# Patient Record
Sex: Male | Born: 1960 | Race: White | Hispanic: No | Marital: Married | Smoking: Never smoker
Health system: Southern US, Community
[De-identification: ages and names within clinical notes are randomized; demographics above are authoritative.]

## PROBLEM LIST (undated history)

## (undated) DIAGNOSIS — J309 Allergic rhinitis, unspecified: Secondary | ICD-10-CM

## (undated) DIAGNOSIS — K219 Gastro-esophageal reflux disease without esophagitis: Secondary | ICD-10-CM

## (undated) DIAGNOSIS — K269 Duodenal ulcer, unspecified as acute or chronic, without hemorrhage or perforation: Secondary | ICD-10-CM

## (undated) DIAGNOSIS — R972 Elevated prostate specific antigen [PSA]: Secondary | ICD-10-CM

## (undated) DIAGNOSIS — T7840XA Allergy, unspecified, initial encounter: Secondary | ICD-10-CM

## (undated) DIAGNOSIS — K200 Eosinophilic esophagitis: Secondary | ICD-10-CM

## (undated) DIAGNOSIS — L719 Rosacea, unspecified: Secondary | ICD-10-CM

## (undated) HISTORY — DX: Eosinophilic esophagitis: K20.0

## (undated) HISTORY — PX: COLONOSCOPY: SHX174

## (undated) HISTORY — DX: Allergy, unspecified, initial encounter: T78.40XA

## (undated) HISTORY — DX: Duodenal ulcer, unspecified as acute or chronic, without hemorrhage or perforation: K26.9

## (undated) HISTORY — PX: UPPER GASTROINTESTINAL ENDOSCOPY: SHX188

## (undated) HISTORY — DX: Gastro-esophageal reflux disease without esophagitis: K21.9

## (undated) HISTORY — DX: Allergic rhinitis, unspecified: J30.9

## (undated) HISTORY — DX: Rosacea, unspecified: L71.9

## (undated) HISTORY — DX: Elevated prostate specific antigen (PSA): R97.20

## (undated) HISTORY — PX: PECTUS EXCAVATUM REPAIR: SHX437

---

## 1999-10-22 ENCOUNTER — Encounter: Payer: Self-pay | Admitting: Otolaryngology

## 1999-10-22 ENCOUNTER — Encounter: Admission: RE | Admit: 1999-10-22 | Discharge: 1999-10-22 | Payer: Self-pay | Admitting: Otolaryngology

## 2001-08-30 ENCOUNTER — Other Ambulatory Visit: Admission: RE | Admit: 2001-08-30 | Discharge: 2001-08-30 | Payer: Self-pay | Admitting: Urology

## 2013-10-05 ENCOUNTER — Encounter: Payer: Self-pay | Admitting: Sports Medicine

## 2013-10-05 ENCOUNTER — Ambulatory Visit (INDEPENDENT_AMBULATORY_CARE_PROVIDER_SITE_OTHER): Payer: 59 | Admitting: Sports Medicine

## 2013-10-05 VITALS — BP 106/72 | HR 58 | Ht 73.0 in | Wt 168.0 lb

## 2013-10-05 DIAGNOSIS — M7582 Other shoulder lesions, left shoulder: Secondary | ICD-10-CM | POA: Insufficient documentation

## 2013-10-05 DIAGNOSIS — M719 Bursopathy, unspecified: Secondary | ICD-10-CM

## 2013-10-05 DIAGNOSIS — M25512 Pain in left shoulder: Secondary | ICD-10-CM

## 2013-10-05 DIAGNOSIS — M25519 Pain in unspecified shoulder: Secondary | ICD-10-CM

## 2013-10-05 DIAGNOSIS — M67919 Unspecified disorder of synovium and tendon, unspecified shoulder: Secondary | ICD-10-CM

## 2013-10-05 MED ORDER — NITROGLYCERIN 0.2 MG/HR TD PT24: MEDICATED_PATCH | TRANSDERMAL | Status: DC

## 2013-10-05 NOTE — Assessment & Plan Note (Signed)
We will use a home exercise program  Nitroglycerin protocol  Recheck in 6 weeks  The calcific deposits are fairly large and he might require barbotage of the calcium for removal if he does not respond to conservative care

## 2013-10-05 NOTE — Patient Instructions (Signed)
Nitroglycerin Protocol   Apply 1/4 nitroglycerin patch to affected area daily.  Change position of patch within the affected area every 24 hours.  You may experience a headache during the first 1-2 weeks of using the patch, these should subside.  If you experience headaches after beginning nitroglycerin patch treatment, you may take your preferred over the counter pain reliever.  Another side effect of the nitroglycerin patch is skin irritation or rash related to patch adhesive.  Please notify our office if you develop more severe headaches or rash, and stop the patch.  Tendon healing with nitroglycerin patch may require 12 to 24 weeks depending on the extent of injury.  Men should not use if taking Viagra, Cialis, or Levitra.   Do not use if you have migraines or rosacea.   Please follow up in 6 weeks  Thank you for seeing us today!  

## 2013-10-05 NOTE — Assessment & Plan Note (Signed)
He will use some Aleve if needed but has not had much pain relief with anti-inflammatory medications

## 2013-10-05 NOTE — Progress Notes (Signed)
Patient ID: Darryl Stanton, male   DOB: Nov 01, 1961, 52 y.o.   MRN: [id number]  SUBJECTIVE:  Referred courtesy of Dr Philipp Deputy  [name] is a 52 yo male present today c/o left shoulder pain that has been present for 6-8 months. He was previously seen by is PCP who suspected rotator cuff pathology and recommended strengthening exercises and Mobic. Patient reports minimal relief from this treatments and presented with localized pain over the anterior shoulder with radiation down the bicep muscle. Pain is exacerbated by pulling to reload a riffle, and using the weed wacker. He describes the pain as a aching that is intermittent. Denies any pain at night and no discomfort with overhead activity. Reports a prior injury of falling on this shoulder as a teenage.    OBJECTIVE: BP 106/72 General: Alert and oriented and in NAD. Neck: Full ROM.   Skin: Warm, dry, intact.  No rashes, lesions, ecchymosis, erythema. Vascular: Radial pulses 2+ bilaterally.  No peripheral edema. Resp: No shortness of breath. Psych: Normal mood and affect.  Pleasant and cooperative. Neuro: A&O x3. Sensation to light touch upper extremities equal and intact bilaterally. Normal gait. Shoulder exam:   Palpation: no tenderness over the [facility] joint, acromion, mild bicipital grove tenderness, no supraspinatus tenderness Local TTP at lateral interval of rotator cuff  ROM active/passive: symmetric full 180 degree of abduction and forward flexion, symmetric internal (80-90) and external rotation (90) with shoudler at 90 abduction. Appley's scratch test equal bilaterlly     Strength testing: 5/5 symmetric strength in internal and external rotation, forward flexion, adduction and abduction     Normal sensation with no sensory or motor defects in C4-C8     Special Test: Negative Neers, neg Hawkins, negative empty can, neg obriens, neg speeds, neg apprehension XOver test not painful on left      US Impression: patient has normal  biceps tendon over the bicipital grove. Evidence of calcification within the distal tendon of the subscapularis proximal to the insertion site on the lesser tubercle of humerus. Normal appearence to the supraspinatus tendon. Normal infraspinatus and teres minor. Mild AC joint DJD with no effusion.  None on AC joint on RT.  IMPRESSION/PLAN: 1. 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 Also Humoral abduction starting at  3 lbs and advance as tolerated at 45 deg forward flexion, at 90 deg abduction and 45 deg extension.

## 2013-11-09 ENCOUNTER — Ambulatory Visit (INDEPENDENT_AMBULATORY_CARE_PROVIDER_SITE_OTHER): Payer: 59 | Admitting: Sports Medicine

## 2013-11-09 ENCOUNTER — Encounter: Payer: Self-pay | Admitting: Sports Medicine

## 2013-11-09 VITALS — BP 111/74 | HR 63 | Ht 73.0 in | Wt 168.0 lb

## 2013-11-09 DIAGNOSIS — M7582 Other shoulder lesions, left shoulder: Secondary | ICD-10-CM

## 2013-11-09 DIAGNOSIS — M67919 Unspecified disorder of synovium and tendon, unspecified shoulder: Secondary | ICD-10-CM

## 2013-11-09 NOTE — Assessment & Plan Note (Signed)
Doing well today. Will continue nitroglycerin at home exercise program. Signs of improvement however still some limitation with external rotation. Will followup in 2 months at that time will repeat ultrasound.

## 2013-11-09 NOTE — Progress Notes (Signed)
Patient ID: AVANTAE BITHER, male   DOB: January 18, 1961, 52 y.o.   MRN: [id number] 52 year old male presents for followup of left shoulder calcific tendinitis. Seen approximately 4 weeks ago and diagnosed with calcific tendinitis of the subscapularis tendon. He's been doing home exercise programs been using topical nitroglycerin. Reports doing much better. Still has some discomfort with certain movements and a little bit of limitation with external rotation however, denies significant amounts of pain.  Review of systems as per history of present illness otherwise negative  Examination: BP 111/74  Pulse 63  Ht 6\' 1"  (1.854 m)  Wt 168 lb (76.204 kg)  BMI 22.17 kg/m2 Well-developed well-nourished 52 year old white male awake alert oriented in no acute distress Left Shoulder: Inspection reveals no abnormalities, atrophy or asymmetry. Palpation is normal with no tenderness over AC joint or bicipital groove. ROM is limited with ER to 45 L vs. 60 R Rotator cuff strength normal throughout. Negative Neer and Hawkin's tests, Equivicol empty can. Speeds and Yergason's tests normal. No labral pathology noted with negative Obrien's, negative clunk and good stability. Normal scapular function observed. No painful arc and no drop arm sign. No apprehension sign

## 2014-01-25 ENCOUNTER — Ambulatory Visit (INDEPENDENT_AMBULATORY_CARE_PROVIDER_SITE_OTHER): Payer: 59 | Admitting: Sports Medicine

## 2014-01-25 ENCOUNTER — Encounter: Payer: Self-pay | Admitting: Sports Medicine

## 2014-01-25 VITALS — BP 103/67 | HR 65 | Ht 73.0 in | Wt 168.0 lb

## 2014-01-25 DIAGNOSIS — M719 Bursopathy, unspecified: Secondary | ICD-10-CM

## 2014-01-25 DIAGNOSIS — M67919 Unspecified disorder of synovium and tendon, unspecified shoulder: Secondary | ICD-10-CM

## 2014-01-25 DIAGNOSIS — M7582 Other shoulder lesions, left shoulder: Secondary | ICD-10-CM

## 2014-01-25 NOTE — Assessment & Plan Note (Signed)
Patient doing well today. May discontinue nitroglycerin. Continue home exercises. Followup as needed.

## 2014-01-25 NOTE — Progress Notes (Signed)
Patient ID: Darryl Stanton Darryl Stanton, male   DOB: 28-Oct-1961, 53 y.o.   MRN: [id number] 53 year old male presents for followup of left calcific rotator cuff tendinitis. Overall he's been doing very well. Reports markedly decreased pain. He's been using nitroglycerin without any issues. Doing home exercise programs although he admits to sporadic compliance with this. Currently has no new complaints.   Review of systems as per history of present illness otherwise negative.  Examination: BP 103/67  Pulse 65  Ht 6\' 1"  (1.854 m)  Wt 168 lb (76.204 kg)  BMI 22.17 kg/m2 Left Shoulder: Inspection reveals no abnormalities, atrophy or asymmetry. Palpation is normal with no tenderness over AC joint or bicipital groove. ROM is full in all planes. Rotator cuff strength normal throughout. No signs of impingement with negative Neer and Hawkin's tests, empty can. Speeds and Yergason's tests normal. No labral pathology noted with negative Obrien's, negative clunk and good stability. Normal scapular function observed. No painful arc and no drop arm sign. No apprehension sign  Musculoskeletal ultrasound of the left shoulder Images of the biceps tendon subscapularis, supra-spinatus, teres minor, infraspinatus and glenohumeral joint were obtained. Images consistent with calcific changes/bony spur within the subscapularis tendon. Overall this seems improved from a prior visit however still present. No evidence of impingement with dynamic range of motion ultrasound.

## 2014-01-25 NOTE — Patient Instructions (Signed)
Your shoulder still has evidence of calcium in the rotator cuff tendonitis.  Your symptoms and exam appear to be improving.  You can stop using the nitroglycerin at this time.  Continue the home exercise program.  If the symptoms worsen return and we will consider further intervention.  Follow up with us as needed.

## 2015-04-29 ENCOUNTER — Ambulatory Visit
Admission: RE | Admit: 2015-04-29 | Discharge: 2015-04-29 | Disposition: A | Discharge status: Home / Self Care | Payer: 59 | Source: Ambulatory Visit | Attending: Family Medicine | Admitting: Family Medicine

## 2015-04-29 ENCOUNTER — Other Ambulatory Visit: Payer: Self-pay | Admitting: Family Medicine

## 2015-04-29 DIAGNOSIS — M25531 Pain in right wrist: Secondary | ICD-10-CM

## 2016-12-28 DIAGNOSIS — R972 Elevated prostate specific antigen [PSA]: Secondary | ICD-10-CM | POA: Diagnosis not present

## 2017-02-15 DIAGNOSIS — R972 Elevated prostate specific antigen [PSA]: Secondary | ICD-10-CM | POA: Diagnosis not present

## 2017-02-21 HISTORY — PX: OTHER SURGICAL HISTORY: SHX169

## 2017-02-26 DIAGNOSIS — J069 Acute upper respiratory infection, unspecified: Secondary | ICD-10-CM | POA: Diagnosis not present

## 2017-03-15 DIAGNOSIS — R972 Elevated prostate specific antigen [PSA]: Secondary | ICD-10-CM | POA: Diagnosis not present

## 2017-05-06 DIAGNOSIS — Z23 Encounter for immunization: Secondary | ICD-10-CM | POA: Diagnosis not present

## 2017-05-06 DIAGNOSIS — Z13220 Encounter for screening for lipoid disorders: Secondary | ICD-10-CM | POA: Diagnosis not present

## 2017-05-06 DIAGNOSIS — Z0000 Encounter for general adult medical examination without abnormal findings: Secondary | ICD-10-CM | POA: Diagnosis not present

## 2017-05-18 DIAGNOSIS — Z1211 Encounter for screening for malignant neoplasm of colon: Secondary | ICD-10-CM | POA: Diagnosis not present

## 2017-09-01 DIAGNOSIS — R972 Elevated prostate specific antigen [PSA]: Secondary | ICD-10-CM | POA: Diagnosis not present

## 2017-09-24 DIAGNOSIS — L02232 Carbuncle of back [any part, except buttock]: Secondary | ICD-10-CM | POA: Diagnosis not present

## 2017-11-23 DIAGNOSIS — K200 Eosinophilic esophagitis: Secondary | ICD-10-CM

## 2017-11-23 HISTORY — DX: Eosinophilic esophagitis: K20.0

## 2018-01-19 DIAGNOSIS — R1013 Epigastric pain: Secondary | ICD-10-CM | POA: Diagnosis not present

## 2018-01-20 ENCOUNTER — Other Ambulatory Visit: Payer: Self-pay | Admitting: Family Medicine

## 2018-01-20 DIAGNOSIS — R1013 Epigastric pain: Secondary | ICD-10-CM

## 2018-02-08 ENCOUNTER — Other Ambulatory Visit: Payer: 59

## 2018-02-08 ENCOUNTER — Ambulatory Visit
Admission: RE | Admit: 2018-02-08 | Discharge: 2018-02-08 | Disposition: A | Discharge status: Home / Self Care | Payer: 59 | Source: Ambulatory Visit | Attending: Family Medicine | Admitting: Family Medicine

## 2018-02-08 DIAGNOSIS — R1013 Epigastric pain: Secondary | ICD-10-CM

## 2018-02-08 DIAGNOSIS — R1011 Right upper quadrant pain: Secondary | ICD-10-CM | POA: Diagnosis not present

## 2018-02-14 ENCOUNTER — Other Ambulatory Visit: Payer: 59

## 2018-02-17 ENCOUNTER — Other Ambulatory Visit (HOSPITAL_COMMUNITY): Payer: Self-pay | Admitting: Gastroenterology

## 2018-02-17 DIAGNOSIS — R1011 Right upper quadrant pain: Secondary | ICD-10-CM

## 2018-02-17 DIAGNOSIS — R1013 Epigastric pain: Secondary | ICD-10-CM | POA: Diagnosis not present

## 2018-02-21 HISTORY — PX: ESOPHAGOGASTRODUODENOSCOPY: SHX1529

## 2018-02-23 ENCOUNTER — Encounter (HOSPITAL_COMMUNITY): Payer: 59

## 2018-03-02 ENCOUNTER — Ambulatory Visit (HOSPITAL_COMMUNITY)
Admission: RE | Admit: 2018-03-02 | Discharge: 2018-03-02 | Disposition: A | Discharge status: Home / Self Care | Payer: 59 | Source: Ambulatory Visit | Attending: Gastroenterology | Admitting: Gastroenterology

## 2018-03-02 DIAGNOSIS — R1011 Right upper quadrant pain: Secondary | ICD-10-CM | POA: Diagnosis not present

## 2018-03-02 DIAGNOSIS — R1013 Epigastric pain: Secondary | ICD-10-CM | POA: Insufficient documentation

## 2018-03-02 MED ORDER — TECHNETIUM TC 99M MEBROFENIN IV KIT
5.0000 | PACK | Freq: Once | INTRAVENOUS | Status: AC | PRN
  Administered 2018-03-02: 5 via INTRAVENOUS

## 2018-03-04 DIAGNOSIS — J329 Chronic sinusitis, unspecified: Secondary | ICD-10-CM | POA: Diagnosis not present

## 2018-03-07 DIAGNOSIS — K209 Esophagitis, unspecified: Secondary | ICD-10-CM | POA: Diagnosis not present

## 2018-03-07 DIAGNOSIS — R1013 Epigastric pain: Secondary | ICD-10-CM | POA: Diagnosis not present

## 2018-03-07 DIAGNOSIS — K2930 Chronic superficial gastritis without bleeding: Secondary | ICD-10-CM | POA: Diagnosis not present

## 2018-04-12 DIAGNOSIS — K200 Eosinophilic esophagitis: Secondary | ICD-10-CM | POA: Diagnosis not present

## 2018-05-25 ENCOUNTER — Encounter: Payer: Self-pay | Admitting: Allergy

## 2018-05-25 ENCOUNTER — Telehealth: Payer: Self-pay | Admitting: *Deleted

## 2018-05-25 ENCOUNTER — Ambulatory Visit (INDEPENDENT_AMBULATORY_CARE_PROVIDER_SITE_OTHER): Payer: 59 | Admitting: Allergy

## 2018-05-25 VITALS — BP 110/60 | HR 76 | Temp 98.2°F | Ht 72.5 in | Wt 172.2 lb

## 2018-05-25 DIAGNOSIS — K200 Eosinophilic esophagitis: Secondary | ICD-10-CM

## 2018-05-25 NOTE — Telephone Encounter (Signed)
I called [facility] on behalf of Dr. Delorse LekPadgett to inquire more office notes and GI biopsy results and tests results from his past visits since GI referred him to us. They took our information and stated that they would give us a call back today to discuss further.

## 2018-05-25 NOTE — Progress Notes (Signed)
New Patient Note  RE: Darryl Stanton MRN: [id number] DOB: [birth year] Date of Office Visit: 05/25/2018  Referring provider: Graylin ShiverGanem, Salem F, MD Primary care provider: Lupita RaiderShaw, Kimberlee, MD  Chief Complaint: Eosinophilic esophagitis  History of present illness: Darryl Stanton is a 57 y.o. male presenting today for consultation for eosinophilic esophagitis.  He presents today with his wife.  He reports having symptoms of abdominal pain and heartburn for a while.   He denies any trouble swallowing or symptoms of food impaction.  He is seeing a gastroenterologist, Dr Evette CristalGanem.  He states he has had a negative gallbladder scan.    He thought he had an ulcer as he has had an intestinal ulcer before and some of the symptoms were similar he believes however this ulcer was diagnosed about 20 or so years ago.  He states he was on a PPI that starts with a D (likely Dexilant) for a period of time before he had an endoscopy done.  He states this PPI did nothing to alter his GI symptoms.  The endoscopy was done about several months ago and per patient showed chronic gastritis and esophageal biopsy was concerning for EoE.  He since was started on swallowed budesonide about 6 weeks ago that he gets compounded into a slurry that he does twice a day.  He does feel that since starting on the swallowed steroid that he is having less abdominal/GI symptoms.  He is no longer on the PPI but instead takes a Pepcid as needed.  He has not been able to identify any particular foods that seem to worsen his GI symptoms.  However he feels that more fatty foods may trigger the abdominal pain.   No history of asthma.  No symptoms consistent with seasonal or perennial allergies.  No history of eczema.  However he states he gets a sinus infection several times a year typically treated with a course of antibiotics.     Review of systems: Review of Systems  Constitutional: Negative for chills, fever, malaise/fatigue and weight loss.    HENT: Negative for congestion, ear discharge, ear pain, nosebleeds and sore throat.   Eyes: Negative for pain, discharge and redness.  Respiratory: Negative for cough, shortness of breath and wheezing.   Cardiovascular: Negative for chest pain.  Gastrointestinal: Positive for abdominal pain and heartburn. Negative for constipation, diarrhea, nausea and vomiting.  Musculoskeletal: Negative for joint pain.  Skin: Negative for itching and rash.  Neurological: Negative for headaches.    All other systems negative unless noted above in HPI  Past medical history: Past Medical History:  Diagnosis Date  . Duodenal ulcer   . Eosinophilic esophagitis   . Rosacea     Past surgical history: Past Surgical History:  Procedure Laterality Date  . PECTUS EXCAVATUM REPAIR     Age 57    Family history:  Family History  Problem Relation Age of Onset  . Hyperlipidemia Mother   . Hyperlipidemia Sister   . Allergic rhinitis Neg Hx   . Asthma Neg Hx   . Eczema Neg Hx   . Urticaria Neg Hx     Social history: Lives in a home with his family with out carpeting with gas and electric heating and central cooling.  Dog in the home.  No concern for water damage, mildew or roaches in the home.  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  He denies smoking history.  Medication List: Allergies as of 05/25/2018  No Known Allergies     Medication List        Accurate as of 05/25/18 11:54 AM. Always use your most recent med list.          budesonide 0.5 MG/2ML nebulizer solution Commonly known as:  PULMICORT Take 0.5 mg by nebulization 2 (two) times daily.   famotidine 20 MG tablet Commonly known as:  PEPCID Take 20 mg by mouth 2 (two) times daily.   meloxicam 15 MG tablet Commonly known as:  MOBIC Take 15 mg by mouth daily as needed.       Known medication allergies: No Known Allergies   Physical examination: Blood pressure 110/60, pulse 76, temperature 98.2 F (36.8 C),  temperature source Oral, height 6' 0.5" (1.842 m), weight 172 lb 3.2 oz (78.1 kg), SpO2 97 %.  General: Alert, interactive, in no acute distress. HEENT: PERRLA, TMs pearly gray, turbinates non-edematous without discharge, post-pharynx non erythematous. Neck: Supple without lymphadenopathy. Lungs: Clear to auscultation without wheezing, rhonchi or rales. {no increased work of breathing. CV: Normal S1, S2 without murmurs. Abdomen: Normal bowel sounds, nondistended, nontender. Skin: Warm and dry, without lesions or rashes. Extremities:  No clubbing, cyanosis or edema. Neuro:   Grossly intact.  Diagnositics/Labs: Allergy testing: Food allergy skin prick testing is negative to common food allergens and common foods in his diet.  Histamine control is positive. Allergy testing results were read and interpreted by provider, documented by clinical staff.   Assessment and plan:   Eosinophilic esophagitis   - Food allergen skin prick tests were negative today despite a positive histamine control.   Foods tested today included foods eaten commonly in his diet peanut/tree nuts, soybean, wheat/grains, sesame, milk, egg, shellfish, fish, rice, pork, chicken, beef, tomato, potato (white and sweet), avocado, onion, cabbage, carrots, corn, cucumber, orange, banana, apple, cantaloupe, watermelon, garlic, pepper, mustard.      Skin testing has great negative predictive value with the exception of milk.      - there are certain food groups that have been implicated in triggering EoE symptoms (dairy/milk, grains, meats however any food has potential to be a trigger).      - swallowed steroid +/- a PPI along with dietary modifications are currently the available management options.   Continue your swallowed budesonide slurry twice a day.   Dietary modifications can also be performed to determine if there are certain foods exacerbating symptoms.  With negative testing for food allergy you can perform elimination  diet.  Recommend starting with one food group elimination at a time for 2-3 weeks with re-introduction of the food to confirm presence of symptoms.  If you do notice improvement with symptoms with elimination then you would want to avoid that food item in the diet.   The top common food (SFED diet) are milk, wheat/grain, egg, soy, peanuts/tree nuts, fish/shellfish.   Nuts and seafood are less commonly a trigger of EoE.     Follow up with Dr. Evette Cristal as scheduled for monitoring.  We will be requesting records from Dr. Evette Cristal including biopsy/pathology reports of his endoscopy  Return to clinic in 4-6 months or sooner if needed  I appreciate the opportunity to take part in Aubry's care. Please do not hesitate to contact me with questions.  Sincerely,   Margo Aye, MD Allergy/Immunology [facility]

## 2018-05-25 NOTE — Patient Instructions (Addendum)
Eosinophilic esophagitis   - Food allergen skin prick tests were negative today despite a positive histamine control.   Foods tested today included peanut/tree nuts, soybean, wheat/grains, sesame, milk, egg, shellfish, fish, rice, pork, chicken, beef, tomato, potato (white and sweet), avocado, onion, cabbage, carrots, corn, cucumber, orange, banana, apple, cantaloupe, watermelon, garlic, pepper, mustard.      Skin testing has great negative predictive value with the exception of milk.      - there are certain food groups that have been implicated in triggering EoE symptoms (dairy/milk, grains, meats however any food has potential to be a trigger).      - swallowed steroid +/- a PPI along with dietary modifications are currently the available management options.   Continue your swallowed budesonide slurry twice a day.   Dietary modifications can also be performed to determine if there are certain foods exacerbating symptoms.  With negative testing for food allergy you can perform elimination diet.  Recommend starting with one food group elimination at a time for 2-3 weeks with re-introduction of the food to confirm presence of symptoms.  If you do notice improvement with symptoms with elimination then you would want to avoid that food item in the diet.   The top common food (SFED diet) are milk, wheat/grain, egg, soy, peanuts/tree nuts, fish/shellfish.   Nuts and seafood are less commonly a trigger of EoE.     Follow up with Dr. Evette CristalGanem as scheduled for monitoring.   Return to clinic in 4-6 months or sooner if needed6

## 2018-05-31 DIAGNOSIS — K200 Eosinophilic esophagitis: Secondary | ICD-10-CM | POA: Diagnosis not present

## 2018-06-02 DIAGNOSIS — Z0000 Encounter for general adult medical examination without abnormal findings: Secondary | ICD-10-CM | POA: Diagnosis not present

## 2018-06-02 DIAGNOSIS — Z23 Encounter for immunization: Secondary | ICD-10-CM | POA: Diagnosis not present

## 2018-06-02 DIAGNOSIS — Z13220 Encounter for screening for lipoid disorders: Secondary | ICD-10-CM | POA: Diagnosis not present

## 2018-06-20 DIAGNOSIS — N179 Acute kidney failure, unspecified: Secondary | ICD-10-CM | POA: Diagnosis not present

## 2018-08-04 DIAGNOSIS — Z23 Encounter for immunization: Secondary | ICD-10-CM | POA: Diagnosis not present

## 2018-09-21 DIAGNOSIS — R972 Elevated prostate specific antigen [PSA]: Secondary | ICD-10-CM | POA: Diagnosis not present

## 2018-10-04 DIAGNOSIS — K200 Eosinophilic esophagitis: Secondary | ICD-10-CM | POA: Diagnosis not present

## 2018-12-29 DIAGNOSIS — H0102B Squamous blepharitis left eye, upper and lower eyelids: Secondary | ICD-10-CM | POA: Diagnosis not present

## 2018-12-29 DIAGNOSIS — H0102A Squamous blepharitis right eye, upper and lower eyelids: Secondary | ICD-10-CM | POA: Diagnosis not present

## 2019-03-20 DIAGNOSIS — K200 Eosinophilic esophagitis: Secondary | ICD-10-CM | POA: Diagnosis not present

## 2019-06-20 LAB — BASIC METABOLIC PANEL
BUN: 14 (ref 4–21)
CO2: 28 — AB (ref 13–22)
Chloride: 104 (ref 99–108)
Creatinine: 1.2 (ref 0.6–1.3)
Glucose: 91
Potassium: 4.2 (ref 3.4–5.3)
Sodium: 141 (ref 137–147)

## 2019-06-20 LAB — COMPREHENSIVE METABOLIC PANEL
Albumin: 4.3 (ref 3.5–5.0)
Calcium: 9.3 (ref 8.7–10.7)
GFR calc Af Amer: 78
GFR calc non Af Amer: 65

## 2019-06-20 LAB — LIPID PANEL
Cholesterol: 192 (ref 0–200)
HDL: 71 — AB (ref 35–70)
LDL Cholesterol: 111
LDl/HDL Ratio: 2.7
Triglycerides: 51 (ref 40–160)

## 2019-06-20 LAB — HEPATIC FUNCTION PANEL
ALT: 18 (ref 10–40)
AST: 17 (ref 14–40)
Alkaline Phosphatase: 50 (ref 25–125)
Bilirubin, Total: 0.8

## 2019-06-20 IMAGING — NM NM HEPATO W/GB/PHARM/[PERSON_NAME]
2 series · 12 of 12 positions shown · non-contrast
Comparison: None.

CLINICAL DATA: Right upper quadrant pain

EXAM:
NUCLEAR MEDICINE HEPATOBILIARY IMAGING WITH GALLBLADDER EF
TECHNIQUE: Sequential images of the abdomen were obtained [DATE] minutes
following intravenous administration of radiopharmaceutical. After
oral ingestion of Ensure, gallbladder ejection fraction was
determined. At 60 min, normal ejection fraction is greater than 33%.
RADIOPHARMACEUTICALS:  5.2 mCi Nc-33m  Choletec IV

[he hepatobiliary · 3.10mm/px · 6 of 51 frames shown (1 of 2)]
[frame 5/51]
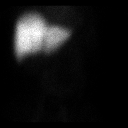
[frame 13/51]
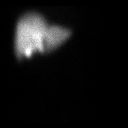
[frame 22/51]
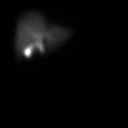
[frame 30/51]
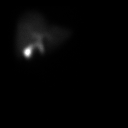
[frame 39/51]
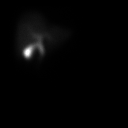
[frame 47/51]
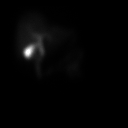

[he hepatobiliary · 3.10mm/px · 6 of 60 frames shown (2 of 2)]
[frame 6/60]
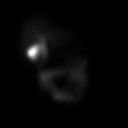
[frame 16/60]
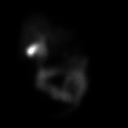
[frame 26/60]
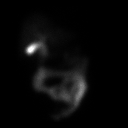
[frame 36/60]
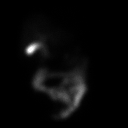
[frame 46/60]
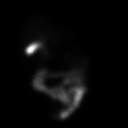
[frame 56/60]
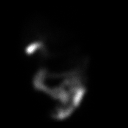

[12 of 12 positions shown; findings below may reference images not displayed]

FINDINGS: Prompt uptake and biliary excretion of activity by the liver is
seen. Gallbladder activity is visualized, consistent with patency of
cystic duct. Biliary activity passes into small bowel, consistent
with patent common bile duct.

Calculated gallbladder ejection fraction is 52%. (Normal gallbladder
ejection fraction with Ensure is greater than 33%.)
IMPRESSION: Normal uptake and excretion of biliary tracer.

Normal gallbladder ejection fraction

## 2020-08-12 ENCOUNTER — Other Ambulatory Visit: Payer: Self-pay

## 2020-08-13 ENCOUNTER — Ambulatory Visit (INDEPENDENT_AMBULATORY_CARE_PROVIDER_SITE_OTHER): Payer: No Typology Code available for payment source | Admitting: Family Medicine

## 2020-08-13 ENCOUNTER — Encounter: Payer: Self-pay | Admitting: Family Medicine

## 2020-08-13 VITALS — BP 127/86 | HR 66 | Temp 98.5°F | Resp 16 | Ht 72.5 in | Wt 170.0 lb

## 2020-08-13 DIAGNOSIS — Z8719 Personal history of other diseases of the digestive system: Secondary | ICD-10-CM | POA: Diagnosis not present

## 2020-08-13 DIAGNOSIS — Z23 Encounter for immunization: Secondary | ICD-10-CM

## 2020-08-13 DIAGNOSIS — Z125 Encounter for screening for malignant neoplasm of prostate: Secondary | ICD-10-CM

## 2020-08-13 DIAGNOSIS — Z0000 Encounter for general adult medical examination without abnormal findings: Secondary | ICD-10-CM

## 2020-08-13 DIAGNOSIS — Z1211 Encounter for screening for malignant neoplasm of colon: Secondary | ICD-10-CM

## 2020-08-13 DIAGNOSIS — K200 Eosinophilic esophagitis: Secondary | ICD-10-CM | POA: Diagnosis not present

## 2020-08-13 LAB — CBC WITH DIFFERENTIAL/PLATELET
Basophils Absolute: 0 10*3/uL (ref 0.0–0.1)
Basophils Relative: 0.9 % (ref 0.0–3.0)
Eosinophils Absolute: 0.1 10*3/uL (ref 0.0–0.7)
Eosinophils Relative: 1.9 % (ref 0.0–5.0)
HCT: 41.7 % (ref 39.0–52.0)
Hemoglobin: 13.9 g/dL (ref 13.0–17.0)
Lymphocytes Relative: 28.4 % (ref 12.0–46.0)
Lymphs Abs: 1.1 10*3/uL (ref 0.7–4.0)
MCHC: 33.4 g/dL (ref 30.0–36.0)
MCV: 96 fl (ref 78.0–100.0)
Monocytes Absolute: 0.5 10*3/uL (ref 0.1–1.0)
Monocytes Relative: 13.7 % — ABNORMAL HIGH (ref 3.0–12.0)
Neutro Abs: 2.2 10*3/uL (ref 1.4–7.7)
Neutrophils Relative %: 55.1 % (ref 43.0–77.0)
Platelets: 213 10*3/uL (ref 150.0–400.0)
RBC: 4.35 Mil/uL (ref 4.22–5.81)
RDW: 14.3 % (ref 11.5–15.5)
WBC: 3.9 10*3/uL — ABNORMAL LOW (ref 4.0–10.5)

## 2020-08-13 LAB — COMPREHENSIVE METABOLIC PANEL
ALT: 28 U/L (ref 0–53)
AST: 23 U/L (ref 0–37)
Albumin: 4.5 g/dL (ref 3.5–5.2)
Alkaline Phosphatase: 72 U/L (ref 39–117)
BUN: 16 mg/dL (ref 6–23)
CO2: 30 mEq/L (ref 19–32)
Calcium: 9.5 mg/dL (ref 8.4–10.5)
Chloride: 103 mEq/L (ref 96–112)
Creatinine, Ser: 1.13 mg/dL (ref 0.40–1.50)
GFR: 66.33 mL/min (ref >60.00)
Glucose, Bld: 89 mg/dL (ref 70–99)
Potassium: 4.3 mEq/L (ref 3.5–5.1)
Sodium: 139 mEq/L (ref 135–145)
Total Bilirubin: 0.7 mg/dL (ref 0.2–1.2)
Total Protein: 6.9 g/dL (ref 6.0–8.3)

## 2020-08-13 LAB — LIPID PANEL
Cholesterol: 198 mg/dL (ref 0–200)
HDL: 74.5 mg/dL (ref >39.00)
LDL Cholesterol: 112 mg/dL — ABNORMAL HIGH (ref 0–99)
NonHDL: 123.37
Total CHOL/HDL Ratio: 3
Triglycerides: 57 mg/dL (ref 0.0–149.0)
VLDL: 11.4 mg/dL (ref 0.0–40.0)

## 2020-08-13 LAB — TSH: TSH: 2.21 u[IU]/mL (ref 0.35–4.50)

## 2020-08-13 NOTE — Addendum Note (Signed)
Addended by: Emi Holes D on: 08/13/2020 09:51 AM   Modules accepted: Orders

## 2020-08-13 NOTE — Progress Notes (Signed)
Office Note 08/13/2020  CC:  Chief Complaint  Patient presents with  . New Patient (Initial Visit)    flu shot    HPI:  Darryl Stanton is a 59 y.o. White male who is here for annual health maintenance exam. Patient's most recent primary MD: Brooks Sailors, had to switch from them and all of his specialists b/c of insurance reasons. Old records in EPIC/HL EMR were reviewed prior to or during today's visit.  He has been doing intermittent fasting diet since 03/2020. Walks for exercise.  No acute complaints.  Past Medical History:  Diagnosis Date  . Allergic rhinitis   . Duodenal ulcer   . Elevated PSA    bx neg  . Eosinophilic esophagitis 2019   EGD appearance did not match + bx results. Allergy testing neg with allergist.  Oral budesonide slurry + food vigilance for food trigger. of sx's.  . Rosacea     Past Surgical History:  Procedure Laterality Date  . COLONOSCOPY  approx 2012  . ESOPHAGOGASTRODUODENOSCOPY  02/2018  . PECTUS EXCAVATUM REPAIR     Age 59  . prsostate biopsy  02/2017   for elevated PSA; 1 core "suspicious" but urol felt it was NOT prostate ca (Dr. Retta Diones): most recent PSA 08/2019 stable.    Family History  Problem Relation Age of Onset  . Hyperlipidemia Mother   . Hyperlipidemia Sister   . Allergic rhinitis Neg Hx   . Asthma Neg Hx   . Eczema Neg Hx   . Urticaria Neg Hx     Social History   Socioeconomic History  . Marital status: Married    Spouse name: Not on file  . Number of children: Not on file  . Years of education: Not on file  . Highest education level: Not on file  Occupational History  . Not on file  Tobacco Use  . Smoking status: Never Smoker  . Smokeless tobacco: Former Neurosurgeon    Types: Engineer, drilling  . Vaping Use: Never used  Substance and Sexual Activity  . Alcohol use: Yes    Alcohol/week: 2.0 standard drinks    Types: 2 Cans of beer per week    Comment: a couple time a week  . Drug use: Never  . Sexual  activity: Not on file  Other Topics Concern  . Not on file  Social History Narrative   Married, 2 sons.   Orig from [place], [place].   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   No tob, occ alc.   Social Determinants of Health   Financial Resource Strain:   . Difficulty of Paying Living Expenses: Not on file  Food Insecurity:   . Worried About Programme researcher, broadcasting/film/video in the Last Year: Not on file  . Ran Out of Food in the Last Year: Not on file  Transportation Needs:   . Lack of Transportation (Medical): Not on file  . Lack of Transportation (Non-Medical): Not on file  Physical Activity:   . Days of Exercise per Week: Not on file  . Minutes of Exercise per Session: Not on file  Stress:   . Feeling of Stress : Not on file  Social Connections:   . Frequency of Communication with Friends and Family: Not on file  . Frequency of Social Gatherings with Friends and Family: Not on file  . Attends Religious Services: Not on file  . Active Member of Clubs or  Organizations: Not on file  . Attends Banker Meetings: Not on file  . Marital Status: Not on file  Intimate Partner Violence:   . Fear of Current or Ex-Partner: Not on file  . Emotionally Abused: Not on file  . Physically Abused: Not on file  . Sexually Abused: Not on file    Outpatient Encounter Medications as of 08/13/2020  Medication Sig  . budesonide (PULMICORT) 0.5 MG/2ML nebulizer solution Take 0.5 mg by nebulization 2 (two) times daily.  . famotidine (PEPCID) 20 MG tablet Take 10 mg by mouth daily.   . [DISCONTINUED] meloxicam (MOBIC) 15 MG tablet Take 15 mg by mouth daily as needed.   No facility-administered encounter medications on file as of 08/13/2020.    No Known Allergies  ROS Review of Systems  Constitutional: Negative for appetite change, chills, fatigue and fever.  HENT: Negative for congestion, dental problem, ear pain and sore throat.   Eyes:  Negative for discharge, redness and visual disturbance.  Respiratory: Negative for cough, chest tightness, shortness of breath and wheezing.   Cardiovascular: Negative for chest pain, palpitations and leg swelling.  Gastrointestinal: Negative for abdominal pain, blood in stool, diarrhea, nausea and vomiting.  Genitourinary: Negative for difficulty urinating, dysuria, flank pain, frequency, hematuria and urgency.  Musculoskeletal: Negative for arthralgias, back pain, joint swelling, myalgias and neck stiffness.  Skin: Negative for pallor and rash.  Neurological: Negative for dizziness, speech difficulty, weakness and headaches.  Hematological: Negative for adenopathy. Does not bruise/bleed easily.  Psychiatric/Behavioral: Negative for confusion and sleep disturbance. The patient is not nervous/anxious.     PE; Blood pressure 127/86, pulse 66, temperature 98.5 F (36.9 C), temperature source Temporal, resp. rate 16, height 6' 0.5" (1.842 m), weight 170 lb (77.1 kg), SpO2 99 %. Body mass index is 22.74 kg/m.  Gen: Alert, well appearing.  Patient is oriented to person, place, time, and situation. AFFECT: pleasant, lucid thought and speech. ENT: Ears: EACs clear, normal epithelium.  TMs with good light reflex and landmarks bilaterally.  Eyes: no injection, icteris, swelling, or exudate.  EOMI, PERRLA. Nose: no drainage or turbinate edema/swelling.  No injection or focal lesion.  Mouth: lips without lesion/swelling.  Oral mucosa pink and moist.  Dentition intact and without obvious caries or gingival swelling.  Oropharynx without erythema, exudate, or swelling.  Neck: supple/nontender.  No LAD, mass, or TM.  Carotid pulses 2+ bilaterally, without bruits. CV: RRR, no m/r/g.   LUNGS: CTA bilat, nonlabored resps, good aeration in all lung fields. ABD: soft, NT, ND, BS normal.  No hepatospenomegaly or mass.  No bruits. EXT: no clubbing, cyanosis, or edema.  Musculoskeletal: no joint swelling,  erythema, warmth, or tenderness.  ROM of all joints intact. Skin - no sores or suspicious lesions or rashes or color changes Rectal: deferred--this is done by his urol  Pertinent labs:  NONE IN EMR  ASSESSMENT AND PLAN:   New pt:  Health maintenance exam: Reviewed age and gender appropriate health maintenance issues (prudent diet, regular exercise, health risks of tobacco and excessive alcohol, use of seatbelts, fire alarms in home, use of sunscreen).  Also reviewed age and gender appropriate health screening as well as vaccine recommendations. Vaccines: Covid UTD.  Tdap->updated today (no date of last Td known).  Flu vaccine today. Labs: fasting HP ordered. Prostate ca screening: followed by urol. Colon ca screening:  He'll get this 2022 at some point.  Hx ox eosinophilic esophagitis: well controlled on budesonide slurry bid and H2 blocker.  Due for routine GI f/u but needs to GI MD due to insurance reasons-->referred to [facility] today. They will discuss timing of next screening colonoscopy as well---likely to be in 2022 based on pt's report of date of 1st TCS at age 7).  Hx of elevated PSA: bx benign per pt report, PSA's stable per his report.  He is in process of seeing if he can continue seeing Dr. Retta Diones with [facility] or not (insurance).  An After Visit Summary was printed and given to the patient.  Return in about 1 year (around 08/13/2021) for annual CPE (fasting).  Signed:  Santiago Bumpers, MD           08/13/2020

## 2020-08-13 NOTE — Patient Instructions (Signed)

## 2020-08-23 ENCOUNTER — Telehealth: Payer: Self-pay

## 2020-08-23 NOTE — Telephone Encounter (Signed)
Patient dropped off form to be completed per his company. Proof of Annual Physical Exam  Was seen by Dr. Milinda Cave on 9/21  Please call Darryl Stanton at [phone] when form completed. He will pick up at front office. Form given to [place]. 10/1 dnh

## 2020-08-23 NOTE — Telephone Encounter (Signed)
Signed and put in box to go up front. Signed:  Santiago Bumpers, MD           08/23/2020

## 2020-08-23 NOTE — Telephone Encounter (Signed)
Placed on PCP desk to review and sign, if appropriate.  

## 2020-10-22 ENCOUNTER — Ambulatory Visit (INDEPENDENT_AMBULATORY_CARE_PROVIDER_SITE_OTHER): Payer: No Typology Code available for payment source | Admitting: Urology

## 2020-10-22 ENCOUNTER — Encounter: Payer: Self-pay | Admitting: Urology

## 2020-10-22 ENCOUNTER — Other Ambulatory Visit: Payer: Self-pay

## 2020-10-22 VITALS — BP 127/63 | HR 69 | Temp 98.6°F | Ht 72.5 in | Wt 165.0 lb

## 2020-10-22 DIAGNOSIS — N400 Enlarged prostate without lower urinary tract symptoms: Secondary | ICD-10-CM

## 2020-10-22 LAB — URINALYSIS, ROUTINE W REFLEX MICROSCOPIC
Bilirubin, UA: NEGATIVE
Glucose, UA: NEGATIVE
Ketones, UA: NEGATIVE
Leukocytes,UA: NEGATIVE
Nitrite, UA: NEGATIVE
Protein,UA: NEGATIVE
RBC, UA: NEGATIVE
Specific Gravity, UA: 1.025 (ref 1.005–1.030)
Urobilinogen, Ur: 0.2 mg/dL (ref 0.2–1.0)
pH, UA: 5.5 (ref 5.0–7.5)

## 2020-10-22 NOTE — Progress Notes (Signed)
H&P  Chief Complaint: Elevated PSA  History of Present Illness:   11.30.2021: Pt here for annual f/u and denies onset of any significant LUTS between visits. He reports staying active and denies any ED symptoms at this time.  IPSS Questionnaire (AUA-7): Over the past month.   1)  How often have you had a sensation of not emptying your bladder completely after you finish urinating?  0 - Not at all  2)  How often have you had to urinate again less than two hours after you finished urinating? 3 - About half the time  3)  How often have you found you stopped and started again several times when you urinated?  0 - Not at all  4) How difficult have you found it to postpone urination?  0 - Not at all  5) How often have you had a weak urinary stream?  0 - Not at all  6) How often have you had to push or strain to begin urination?  0 - Not at all  7) How many times did you most typically get up to urinate from the time you went to bed until the time you got up in the morning?  1 - 1 time  Total score:  0-7 mildly symptomatic   8-19 moderately symptomatic   20-35 severely symptomatic  QoL Score: 1   (below copied from [facility] records):  Darryl Stanton is a 59 year-old male established patient who is here for follow up for further evaluation of his elevated PSA. His last PSA was performed 09/21/2018. The last PSA value was 3.14.   He has had a prostate biopsy done.   Underwent TRUS/Bx of prostate on 4.23.2018. Prostatic volume 19.5 ml. PSA 3.75. PSAD 0.19. 1/12 cores (left apex medial) returned atypia.   10.26.2020: Most recent PSA was 3.14 (10.30.19). Aside from some occasional urinary urgency, he denies any significant urinary complaints. He denies drinking much caffeine but he does believe that drinking elderberry juice has made this worse.   Past Medical History:  Diagnosis Date  . Allergic rhinitis   . Duodenal ulcer   . Elevated PSA    bx neg  . Eosinophilic esophagitis 2019   EGD  appearance did not match + bx results. Allergy testing neg with allergist.  Oral budesonide slurry + food vigilance for food trigger. of sx's.  . Rosacea     Past Surgical History:  Procedure Laterality Date  . COLONOSCOPY  approx 2012  . ESOPHAGOGASTRODUODENOSCOPY  02/2018  . PECTUS EXCAVATUM REPAIR     Age 40  . prsostate biopsy  02/2017   for elevated PSA; 1 core "suspicious" but urol felt it was NOT prostate ca (Dr. Retta Diones): most recent PSA 08/2019 stable.    Home Medications:  Allergies as of 10/22/2020   No Known Allergies     Medication List       Accurate as of October 22, 2020 12:39 PM. If you have any questions, ask your nurse or doctor.        budesonide 0.5 MG/2ML nebulizer solution Commonly known as: PULMICORT Take 0.5 mg by nebulization 2 (two) times daily.   famotidine 20 MG tablet Commonly known as: PEPCID Take 10 mg by mouth daily.       Allergies: No Known Allergies  Family History  Problem Relation Age of Onset  . Hyperlipidemia Mother   . Hyperlipidemia Sister   . Allergic rhinitis Neg Hx   . Asthma Neg Hx   .  Eczema Neg Hx   . Urticaria Neg Hx     Social History:  reports that he has never smoked. He quit smokeless tobacco use about 35 years ago.  His smokeless tobacco use included chew. He reports current alcohol use of about 2.0 standard drinks of alcohol per week. He reports that he does not use drugs.  ROS: A complete review of systems was performed.  All systems are negative except for pertinent findings as noted.  Physical Exam:  Vital signs in last 24 hours: There were no vitals taken for this visit. Constitutional:  Alert and oriented, No acute distress Respiratory: Normal respiratory effort GI: Abdomen is soft, nontender, nondistended, no abdominal masses. No CVAT. No hernias Genitourinary: Normal male phallus, testes are descended bilaterally and non-tender and without masses, scrotum is normal in appearance without  lesions or masses, perineum is normal on inspection. Prostate feels about 30 grams. Neurologic: Grossly intact, no focal deficits Psychiatric: Normal mood and affect  I have reviewed prior pt notes  I have reviewed notes from referring/previous physicians  I have reviewed urinalysis results  I have independently reviewed prior imaging  I have reviewed prior PSA results    Impression/Assessment:  Elevated PSA/negative prior bx.  Pt PSA pending for today.  Overall, prostate exams and PSAs have been stable.  Plan:  1. Pt PSA will be drawn today.  2. Pt advised regarding heart-healthy/prostate-healthy diet and lifestyle.  3. F/U in 1 year for OV and PSA  CC: Dr. Nicoletta Ba

## 2020-10-22 NOTE — Progress Notes (Signed)

## 2020-10-23 LAB — PSA: Prostate Specific Ag, Serum: 2.7 ng/mL (ref 0.0–4.0)

## 2020-10-25 ENCOUNTER — Telehealth: Payer: Self-pay

## 2020-10-25 ENCOUNTER — Encounter: Payer: Self-pay | Admitting: Internal Medicine

## 2020-10-25 NOTE — Telephone Encounter (Signed)
Pt made aware of normal PSA.

## 2020-10-25 NOTE — Telephone Encounter (Signed)
-----   Message from Marcine Matar, MD sent at 10/24/2020 11:58 AM EST ----- Notify patient PSA is 2.7/normal ----- Message ----- From: Dalia Heading, LPN Sent: 89/01/8100   8:39 AM EST To: Marcine Matar, MD  Pls review.

## 2020-12-05 ENCOUNTER — Ambulatory Visit (INDEPENDENT_AMBULATORY_CARE_PROVIDER_SITE_OTHER): Payer: No Typology Code available for payment source | Admitting: Internal Medicine

## 2020-12-05 ENCOUNTER — Encounter: Payer: Self-pay | Admitting: Internal Medicine

## 2020-12-05 VITALS — BP 118/80 | HR 60 | Ht 77.0 in | Wt 170.6 lb

## 2020-12-05 DIAGNOSIS — K200 Eosinophilic esophagitis: Secondary | ICD-10-CM

## 2020-12-05 DIAGNOSIS — K219 Gastro-esophageal reflux disease without esophagitis: Secondary | ICD-10-CM | POA: Diagnosis not present

## 2020-12-05 DIAGNOSIS — Z1211 Encounter for screening for malignant neoplasm of colon: Secondary | ICD-10-CM | POA: Diagnosis not present

## 2020-12-05 NOTE — Patient Instructions (Signed)
  Please follow up with Dr. Marina Goodell as neededc  Due to recent changes in healthcare laws, you may see the results of your imaging and laboratory studies on MyChart before your provider has had a chance to review them.  We understand that in some cases there may be results that are confusing or concerning to you. Not all laboratory results come back in the same time frame and the provider may be waiting for multiple results in order to interpret others.  Please give [place] 48 hours in order for your provider to thoroughly review all the results before contacting the office for clarification of your results.   If you are age 6 or older, your body mass index should be between 23-30. Your Body mass index is 20.23 kg/m. If this is out of the aforementioned range listed, please consider follow up with your Primary Care Provider.  If you are age 50 or younger, your body mass index should be between 19-25. Your Body mass index is 20.23 kg/m. If this is out of the aformentioned range listed, please consider follow up with your Primary Care Provider.

## 2020-12-05 NOTE — Progress Notes (Signed)
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  Previous GI care provided by Dr. Karma Greaser until his retirement.  Patient carries a diagnosis of "eosinophilic esophagitis" for which she is currently on budesonide slurry 2 mg once daily.  I have reviewed outside records from [facility] gastroenterology.  As well pathology.  In brief, the patient was evaluated in 2019 for vague dyspeptic symptoms.  The patient describes it as "nervousness" in the region of the epigastric area.  He does have a history of mild intermittent classic reflux symptoms for which she uses on-demand H2 receptor antagonist therapy.  No history of problems with dysphagia.  For his dyspeptic symptoms he did undergo upper endoscopy March 07, 2018.  This was entirely normal.  Biopsies were taken from the gastroesophageal junction and stomach.  Gastric biopsies revealed nonspecific "chronic gastritis" without Helicobacter pylori.  Esophageal biopsies were said to show 31 eosinophils per high-power field which raised a question of eosinophilic esophagitis.  No repeat endoscopy with or without high-dose PPI performed.  He was placed on budesonide slurry 2 g twice daily.  At 1 point this was decreased to once daily.  Patient has had no significant abdominal complaints.  Does have ongoing occasional mild intermittent reflux.  No dysphagia.  He also underwent routine colonoscopy around 2012 or 2013.  This was reported to be normal in the outside record notes.  No family history of colon cancer.  He has completed his COVID vaccination series  REVIEW OF SYSTEMS:  All non-GI ROS negative unless otherwise stated in the HPI except for occasional sinus troubles  Past Medical History:  Diagnosis Date  . Allergic rhinitis   . Duodenal ulcer   . Elevated PSA    bx neg  . Eosinophilic esophagitis 2019   EGD appearance did not match + bx results.  Allergy testing neg with allergist.  Oral budesonide slurry + food vigilance for food trigger. of sx's.  . Rosacea     Past Surgical History:  Procedure Laterality Date  . COLONOSCOPY  approx 2012  . ESOPHAGOGASTRODUODENOSCOPY  02/2018  . PECTUS EXCAVATUM REPAIR     Age 36  . prsostate biopsy  02/2017   for elevated PSA; 1 core "suspicious" but urol felt it was NOT prostate ca (Dr. Retta Diones): most recent PSA 08/2019 stable.    Social History Darryl Stanton  reports that he has never smoked. He quit smokeless tobacco use about 36 years ago.  His smokeless tobacco use included chew. He reports current alcohol use of about 2.0 standard drinks of alcohol per week. He reports that he does not use drugs.  family history includes Hyperlipidemia in his mother and sister.  No Known Allergies     PHYSICAL EXAMINATION: Vital signs: BP 118/80 (BP Location: Left Arm, Patient Position: Sitting)   Pulse 60   Ht 6\' 5"  (1.956 m)   Wt 170 lb 9.6 oz (77.4 kg)   SpO2 99%   BMI 20.23 kg/m   Constitutional: generally well-appearing, no acute distress Psychiatric: alert and oriented x3, cooperative Eyes: extraocular movements intact, anicteric, conjunctiva pink Mouth: oral pharynx moist, no lesions Neck: supple no lymphadenopathy Cardiovascular: heart regular rate and rhythm, no murmur Lungs: clear to auscultation bilaterally Abdomen: soft, nontender, nondistended, no obvious ascites, no peritoneal signs, normal bowel sounds, no organomegaly Rectal: Omitted Extremities: no clubbing, cyanosis, or lower extremity edema bilaterally Skin:  no lesions on visible extremities Neuro: No focal deficits.  Cranial nerves intact  ASSESSMENT:  1.  Esophageal eosinophilia on focal biopsies of the distal esophagus at the gastroesophageal junction.  This is a normal-appearing esophagus and patient with intermittent reflux symptoms.  I suspect this is less likely to be primary eosinophilic esophagitis, but  rather secondary esophageal eosinophilia due to reflux disease. 2.  Normal colonoscopy 9 or 10 years ago.  Patient thinks he was 50 at the time of his index exam   PLAN:  1.  Stop budesonide 2.  Reflux precautions 3.  H2 receptor antagonist therapy on demand 4.  Screening colonoscopy later this year.  The patient will contact the office to arrange his examination 5.  Routine office GI follow-up 1 year 6.  Contact the office in the interim for any questions or problems.  Should the patient have recurrent symptoms then I would recommend repeat endoscopy with biopsies of the distal, mid, and proximal esophagus.  If significant eosinophilia present, I would place him on high-dose PPI therapy for 8 weeks to be followed by repeat endoscopy with biopsies.

## 2021-02-10 ENCOUNTER — Encounter: Payer: Self-pay | Admitting: Family Medicine

## 2021-02-10 ENCOUNTER — Telehealth (INDEPENDENT_AMBULATORY_CARE_PROVIDER_SITE_OTHER): Payer: No Typology Code available for payment source | Admitting: Family Medicine

## 2021-02-10 DIAGNOSIS — K200 Eosinophilic esophagitis: Secondary | ICD-10-CM | POA: Insufficient documentation

## 2021-02-10 DIAGNOSIS — L719 Rosacea, unspecified: Secondary | ICD-10-CM | POA: Insufficient documentation

## 2021-02-10 DIAGNOSIS — J309 Allergic rhinitis, unspecified: Secondary | ICD-10-CM | POA: Diagnosis not present

## 2021-02-10 MED ORDER — PREDNISONE 20 MG PO TABS: ORAL_TABLET | ORAL | 0 refills | Status: DC

## 2021-02-10 MED ORDER — FLUTICASONE PROPIONATE 50 MCG/ACT NA SUSP: 2.0000 | Freq: Every day | NASAL | 6 refills | Status: DC

## 2021-02-10 NOTE — Progress Notes (Signed)
Virtual Visit via Video Note  I connected with pt on 02/10/21 at  4:00 PM EDT by a video enabled telemedicine application and verified that I am speaking with the correct person using two identifiers.  Location patient: home, [place] Location provider:work or home office Persons participating in the virtual visit: patient, provider  I discussed the limitations of evaluation and management by telemedicine and the availability of in person appointments. The patient expressed understanding and agreed to proceed.  Telemedicine visit is a necessity given the COVID-19 restrictions in place at the current time.  HPI: 60 y/o WM being seen today for "sinus congestion". Last couple weeks, nasal cong/runny nose with PND waxing and waning.  Some ST from PND. No cough or wheeze.  No HA or face pain.  No upper teeth pain.   Saline nasal spray, occ sudafed.  Nasacort yesterday.   Allegra d trial at some point during all this.   ROS: See pertinent positives and negatives per HPI.  Past Medical History:  Diagnosis Date  . Allergic rhinitis   . Duodenal ulcer   . Elevated PSA    bx neg  . Eosinophilic esophagitis 2019   EGD appearance did not match + bx results. Allergy testing neg with allergist.  Oral budesonide slurry + food vigilance for food trigger. of sx's.  . Rosacea     Past Surgical History:  Procedure Laterality Date  . COLONOSCOPY  approx 2012  . ESOPHAGOGASTRODUODENOSCOPY  02/2018  . PECTUS EXCAVATUM REPAIR     Age 78  . prsostate biopsy  02/2017   for elevated PSA; 1 core "suspicious" but urol felt it was NOT prostate ca (Dr. Retta Diones): most recent PSA 08/2019 stable.     Current Outpatient Medications:  .  budesonide (PULMICORT) 0.5 MG/2ML nebulizer solution, Take 0.5 mg by nebulization 2 (two) times daily. Budesonide Slurry  14ml by mouth once daily (Patient not taking: Reported on 02/10/2021), Disp: , Rfl:   EXAM:  VITALS per patient if applicable:  Vitals with BMI 12/05/2020  10/22/2020 08/13/2020  Height 6\' 5"  6' 0.5" 6' 0.5"  Weight 170 lbs 10 oz 165 lbs 170 lbs  BMI 20.23 22.06 22.73  Systolic 118 127  Diastolic 80 63 86  Pulse 60 69 66     GENERAL: alert, oriented, appears well and in no acute distress  HEENT: atraumatic, conjunttiva clear, no obvious abnormalities on inspection of external nose and ears  NECK: normal movements of the head and neck  LUNGS: on inspection no signs of respiratory distress, breathing rate appears normal, no obvious gross SOB, gasping or wheezing  CV: no obvious cyanosis  MS: moves all visible extremities without noticeable abnormality  PSYCH/NEURO: pleasant and cooperative, no obvious depression or anxiety, speech and thought processing grossly intact  LABS: none today    Chemistry      Component Value Date/Time   NA 139 08/13/2020 0944   NA 141 06/20/2019 0000   K 4.3 08/13/2020 0944   CL 103 08/13/2020 0944   CO2 30 08/13/2020 0944   BUN 16 08/13/2020 0944   BUN 14 06/20/2019 0000   CREATININE 1.13 08/13/2020 0944   GLU 91 06/20/2019 0000      Component Value Date/Time   CALCIUM 9.5 08/13/2020 0944   ALKPHOS 72 08/13/2020 0944   AST 23 08/13/2020 0944   ALT 28 08/13/2020 0944   BILITOT 0.7 08/13/2020 0944      ASSESSMENT AND PLAN:  Discussed the following assessment and plan:  Allergic rhinitis. I don't have high suspicion of infectious sinusitis. Start prednisone 40mg  qd x 5d. Start flonase 2 sprays each nostril qd. Discussed starting daily nonsedating antihistamine of his choice--otc. Continue saline nasal spray.   I discussed the assessment and treatment plan with the patient. The patient was provided an opportunity to ask questions and all were answered. The patient agreed with the plan and demonstrated an understanding of the instructions.   F/u: prn  Signed:  , MD           02/10/2021

## 2021-03-31 ENCOUNTER — Encounter: Payer: Self-pay | Admitting: Internal Medicine

## 2021-05-21 DIAGNOSIS — U071 COVID-19: Secondary | ICD-10-CM

## 2021-05-21 HISTORY — DX: COVID-19: U07.1

## 2021-05-22 ENCOUNTER — Telehealth (INDEPENDENT_AMBULATORY_CARE_PROVIDER_SITE_OTHER): Payer: No Typology Code available for payment source | Admitting: Family Medicine

## 2021-05-22 DIAGNOSIS — U071 COVID-19: Secondary | ICD-10-CM | POA: Diagnosis not present

## 2021-05-22 DIAGNOSIS — J329 Chronic sinusitis, unspecified: Secondary | ICD-10-CM

## 2021-05-22 MED ORDER — AMOXICILLIN-POT CLAVULANATE 875-125 MG PO TABS: 1.0000 | ORAL_TABLET | Freq: Two times a day (BID) | ORAL | 0 refills | Status: DC

## 2021-05-22 NOTE — Progress Notes (Signed)
Virtual Visit via Video Note  I connected with Darryl Stanton on 05/22/21 at 1:14 PM by a video enabled telemedicine application and verified that I am speaking with the correct person using two identifiers.  Patient location: home My location: office [place]   I discussed the limitations, risks, security and privacy concerns of performing an evaluation and management service by telephone and the availability of in person appointments. I also discussed with the patient that there may be a patient responsible charge related to this service. The patient expressed understanding and agreed to proceed, consent obtained  Chief complaint:  Chief Complaint  Patient presents with   Covid Positive    Tested positive last night, symptoms started over the past several weeks - has constant sinus congestion, but fever started last night      History of Present Illness: Darryl Stanton is a 60 y.o. male  Covid 7 infection: Tested positive last night.  Wife also tested positive yesterday.  Recent trip to [place]. Felt ok until yesterday - body aches, felt feverish.  Temp 100.5 last night. Increased mucus, nasal congestion this am.  Tx: vitamin D, C, zinc, corsetin supplement, sudafed Some increased nasal congestion past few weeks.  No dyspnea, confusion, drinking fluids normally.  Declines antiviral meds. Would be possibly interested in monoclonal antibody. Would like ot research further and will let me know.  Has had some persistent sinus congestion past few weeks. Unknown if discolored.  Using flonase.    Covid vaccine in March and April 2021, booster.   Patient Active Problem List   Diagnosis Date Noted   Eosinophilic esophagitis 02/10/2021   Allergic rhinitis 02/10/2021   Rosacea 02/10/2021   Pain in joint, shoulder region 10/05/2013   Tendinitis of left rotator cuff 10/05/2013   Past Medical History:  Diagnosis Date   Allergic rhinitis    Duodenal ulcer    Elevated PSA    bx  neg   Eosinophilic esophagitis 2019   EGD appearance did not match + bx results. Allergy testing neg with allergist.  Oral budesonide slurry + food vigilance for food trigger. of sx's.   Rosacea    Past Surgical History:  Procedure Laterality Date   COLONOSCOPY  approx 2012   ESOPHAGOGASTRODUODENOSCOPY  02/2018   PECTUS EXCAVATUM REPAIR     Age 51   prsostate biopsy  02/2017   for elevated PSA; 1 core "suspicious" but urol felt it was NOT prostate ca (Dr. Retta Diones): most recent PSA 08/2019 stable.   Allergies  Allergen Reactions   Ibuprofen     Other reaction(s): stomach upset   Prior to Admission medications   Medication Sig Start Date End Date Taking? Authorizing Provider  fluticasone (FLONASE) 50 MCG/ACT nasal spray Place 2 sprays into both nostrils daily. For treatment of nasal/sinus allergies. 02/10/21  Yes McGowen, Maryjean Morn, MD  budesonide (PULMICORT) 0.5 MG/2ML nebulizer solution Take 0.5 mg by nebulization 2 (two) times daily. Budesonide Slurry  46ml by mouth once daily Patient not taking: Reported on 02/10/2021    [provider]  predniSONE (DELTASONE) 20 MG tablet 2 tabs po qd x 5d 02/10/21   McGowen, Maryjean Morn, MD   Social History   Socioeconomic History   Marital status: Married    Spouse name: Not on file   Number of children: Not on file   Years of education: Not on file   Highest education level: Not on file  Occupational History   Not on file  Tobacco Use  Smoking status: Never   Smokeless tobacco: Former    Types: Chew    Quit date: 11/23/1984  Vaping Use   Vaping Use: Never used  Substance and Sexual Activity   Alcohol use: Yes    Alcohol/week: 2.0 standard drinks    Types: 2 Cans of beer per week    Comment: a couple time a week   Drug use: Never   Sexual activity: Not on file  Other Topics Concern   Not on file  Social History Narrative   Married, 2 sons.   Orig from [place], [place].   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   No tob, occ alc.   Social Determinants of Health   Financial Resource Strain: Not on file  Food Insecurity: Not on file  Transportation Needs: Not on file  Physical Activity: Not on file  Stress: Not on file  Social Connections: Not on file  Intimate Partner Violence: Not on file    Observations/Objective: There were no vitals filed for this visit. Nontoxic appearance on video.  Speaking in full sentences, no cough, no respiratory distress.  Appropriate sponsors, all questions answered with understanding of plan expressed.  Assessment and Plan: COVID-19 virus infection  Recurrent sinusitis - Plan: amoxicillin-clavulanate (AUGMENTIN) 875-125 MG tablet  Longstanding sinus issues with increased congestion past few weeks, now with onset of fever last night, malaise, positive COVID test last night.  May have component of chronic sinusitis with secondary COVID infection.  Less likely acute sinusitis.  No red flags on exam or history regarding COVID infection.  Received primary vaccine but no booster.  -Antivirals were discussed, declined.  -He would consider monoclonal antibody but would like to research that further and will let me know if he would like that treatment.  -Symptomatic care with Mucinex, saline nasal spray, Tylenol, fluids and rest discussed along with RTC/ER/urgent care precautions.  -Augmentin prescribed if signs/symptoms of acute sinusitis including persistent discolored nasal discharge with sinus pain/pressure.  Understanding expressed as well as discussed potential side effects and risks.  Follow Up Instructions: As needed, ER precautions.   I discussed the assessment and treatment plan with the patient. The patient was provided an opportunity to ask questions and all were answered. The patient agreed with the plan and demonstrated an understanding of the instructions.   The patient was advised to call back or seek an in-person  evaluation if the symptoms worsen or if the condition fails to improve as anticipated.  I provided 20 minutes of non-face-to-face time during this encounter.   Darryl Flood, MD

## 2021-05-22 NOTE — Patient Instructions (Signed)
Good talking you today.  Sorry to hear that you became infected.  Continue Tylenol as needed for body aches, sore throat or headache.  Mucinex if needed for congestion.  Saline nasal spray may also be helpful for nasal congestion.  Let me know if you would like to consider other treatments including the monoclonal antibody and I can order that treatment.  If any confusion, difficulty drinking fluids, or shortness of breath I recommend evaluation through urgent care or emergency room.  Regarding your continued sinus congestion and history of infections, I did send a prescription for Augmentin to the pharmacy if you have persistent discolored nasal discharge with the sinus congestion, that could indicate bacterial infection.    Everyone who has presumed or confirmed COVID-19 should stay home and isolate from other people for at least 5 full days (day 0 is the first day of symptoms or the date of the day of the positive viral test for asymptomatic persons). You can end isolation after 5 full days if you are fever-free for 24 hours without the use of fever-reducing medication and your other symptoms have improved (Loss of taste and smell may persist for weeks or months after recovery and need not delay the end of isolation). You should continue to wear a well-fitting mask around others at home and in public for 5 additional days (day 6 through day 10) after the end of your 5-day isolation period. If you are unable to wear a mask when around others, you should continue to isolate for a full 10 days. Avoid people who have weakened immune systems or are more likely to get very sick from COVID-19, and nursing homes and other high-risk settings, until after at least 10 days.  If you continue to have fever or your other symptoms have not improved after 5 days of isolation, you should wait to end your isolation until you are fever-free for 24 hours without the use of fever-reducing medication and your other symptoms have  improved. Continue to wear a well-fitting mask through day 10.   [email or web address]

## 2021-06-10 ENCOUNTER — Ambulatory Visit (AMBULATORY_SURGERY_CENTER): Payer: Self-pay | Admitting: *Deleted

## 2021-06-10 ENCOUNTER — Other Ambulatory Visit: Payer: Self-pay

## 2021-06-10 VITALS — Ht 74.0 in | Wt 174.0 lb

## 2021-06-10 DIAGNOSIS — Z1211 Encounter for screening for malignant neoplasm of colon: Secondary | ICD-10-CM

## 2021-06-10 MED ORDER — SUTAB 1479-225-188 MG PO TABS: 24.0000 | ORAL_TABLET | ORAL | 0 refills | Status: DC

## 2021-06-10 NOTE — Progress Notes (Signed)
No egg or soy allergy known to patient  No issues with past sedation with any surgeries or procedures Patient denies ever being told they had issues or difficulty with intubation  No FH of Malignant Hyperthermia No diet pills per patient No home 02 use per patient  No blood thinners per patient  Pt denies issues with constipation  No A fib or A flutter  EMMI video to pt or via MyChart  COVID 19 guidelines implemented in PV today with Pt and RN   Pt is fully vaccinated  for Covid   Sutab  Coupon given to pt in PV today , Code to Pharmacy and  NO PA's for preps discussed with pt In PV today  Discussed with pt there will be an out-of-pocket cost for prep and that varies from $0 to 70 +  dollars   Due to the COVID-19 pandemic we are asking patients to follow certain guidelines.  Pt aware of COVID protocols and [facility] guidelines   

## 2021-06-24 ENCOUNTER — Encounter: Payer: Self-pay | Admitting: Internal Medicine

## 2021-06-24 ENCOUNTER — Ambulatory Visit (AMBULATORY_SURGERY_CENTER): Payer: No Typology Code available for payment source | Admitting: Internal Medicine

## 2021-06-24 ENCOUNTER — Other Ambulatory Visit: Payer: Self-pay

## 2021-06-24 VITALS — BP 123/78 | HR 62 | Temp 98.0°F | Resp 11 | Ht 74.0 in | Wt 174.0 lb

## 2021-06-24 DIAGNOSIS — Z1211 Encounter for screening for malignant neoplasm of colon: Secondary | ICD-10-CM

## 2021-06-24 MED ORDER — SODIUM CHLORIDE 0.9 % IV SOLN: 500.0000 mL | Freq: Once | INTRAVENOUS | Status: DC

## 2021-06-24 NOTE — Patient Instructions (Signed)
Impression/Recommendations:  Hemorrhoid handout given to patient.  Repeat colonoscopy in 10 years for screening purposes.  Resume previous diet. Continue present medications.  YOU HAD AN ENDOSCOPIC PROCEDURE TODAY AT THE  ENDOSCOPY CENTER:   Refer to the procedure report that was given to you for any specific questions about what was found during the examination.  If the procedure report does not answer your questions, please call your gastroenterologist to clarify.  If you requested that your care partner not be given the details of your procedure findings, then the procedure report has been included in a sealed envelope for you to review at your convenience later.  YOU SHOULD EXPECT: Some feelings of bloating in the abdomen. Passage of more gas than usual.  Walking can help get rid of the air that was put into your GI tract during the procedure and reduce the bloating. If you had a lower endoscopy (such as a colonoscopy or flexible sigmoidoscopy) you may notice spotting of blood in your stool or on the toilet paper. If you underwent a bowel prep for your procedure, you may not have a normal bowel movement for a few days.  Please Note:  You might notice some irritation and congestion in your nose or some drainage.  This is from the oxygen used during your procedure.  There is no need for concern and it should clear up in a day or so.  SYMPTOMS TO REPORT IMMEDIATELY:   Following lower endoscopy (colonoscopy or flexible sigmoidoscopy):  Excessive amounts of blood in the stool  Significant tenderness or worsening of abdominal pains  Swelling of the abdomen that is new, acute  Fever of 100F or higher For urgent or emergent issues, a gastroenterologist can be reached at any hour by calling [phone]. Do not use MyChart messaging for urgent concerns.    DIET:  We do recommend a small meal at first, but then you may proceed to your regular diet.  Drink plenty of fluids but you  should avoid alcoholic beverages for 24 hours.  ACTIVITY:  You should plan to take it easy for the rest of today and you should NOT DRIVE or use heavy machinery until tomorrow (because of the sedation medicines used during the test).    FOLLOW UP: Our staff will call the number listed on your records 48-72 hours following your procedure to check on you and address any questions or concerns that you may have regarding the information given to you following your procedure. If we do not reach you, we will leave a message.  We will attempt to reach you two times.  During this call, we will ask if you have developed any symptoms of COVID 19. If you develop any symptoms (ie: fever, flu-like symptoms, shortness of breath, cough etc.) before then, please call [phone].  If you test positive for Covid 19 in the 2 weeks post procedure, please call and report this information to us.    If any biopsies were taken you will be contacted by phone or by letter within the next 1-3 weeks.  Please call us at [phone] if you have not heard about the biopsies in 3 weeks.    SIGNATURES/CONFIDENTIALITY: You and/or your care partner have signed paperwork which will be entered into your electronic medical record.  These signatures attest to the fact that that the information above on your After Visit Summary has been reviewed and is understood.  Full responsibility of the confidentiality of this discharge information lies with   you and/or your care-partner. 

## 2021-06-24 NOTE — Op Note (Signed)
[facility] Patient Name: Darryl Stanton Procedure Date: 06/24/2021 10:26 AM MRN: [id number] Endoscopist: Wilhemina Bonito. Marina Goodell , MD Age: 60 Referring MD:  Date of Birth: 11-22-1961 Gender: Male Account #: [id number] Procedure:                Colonoscopy Indications:              Screening for colorectal malignant neoplasm.                            Reports negative index exam 10 years ago (elsewhere) Medicines:                Monitored Anesthesia Care Procedure:                Pre-Anesthesia Assessment:                           - Prior to the procedure, a History and Physical                            was performed, and patient medications and                            allergies were reviewed. The patient's tolerance of                            previous anesthesia was also reviewed. The risks                            and benefits of the procedure and the sedation                            options and risks were discussed with the patient.                            All questions were answered, and informed consent                            was obtained. Prior Anticoagulants: The patient has                            taken no previous anticoagulant or antiplatelet                            agents. ASA Grade Assessment: I - A normal, healthy                            patient. After reviewing the risks and benefits,                            the patient was deemed in satisfactory condition to                            undergo the procedure.  After obtaining informed consent, the colonoscope                            was passed under direct vision. Throughout the                            procedure, the patient's blood pressure, pulse, and                            oxygen saturations were monitored continuously. The                            Olympus CF-HQ190L (Serial# [id number]) Colonoscope was                            introduced through the anus  and advanced to the the                            cecum, identified by appendiceal orifice and                            ileocecal valve. The ileocecal valve, appendiceal                            orifice, and rectum were photographed. The quality                            of the bowel preparation was excellent. The                            colonoscopy was performed without difficulty. The                            patient tolerated the procedure well. The bowel                            preparation used was Plenvu via split dose                            instruction. Scope In: 10:42:53 AM Scope Out: 10:57:12 AM Scope Withdrawal Time: 0 hours 11 minutes 59 seconds  Total Procedure Duration: 0 hours 14 minutes 19 seconds  Findings:                 The entire examined colon appeared normal on direct                            and retroflexion views.                           Internal hemorrhoids were found during retroflexion. Complications:            No immediate complications. Estimated blood loss:  None. Estimated Blood Loss:     Estimated blood loss: none. Impression:               - The entire examined colon is normal on direct and                            retroflexion views.                           - Internal hemorrhoids.                           - No specimens collected. Recommendation:           - Repeat colonoscopy in 10 years for screening                            purposes.                           - Patient has a contact number available for                            emergencies. The signs and symptoms of potential                            delayed complications were discussed with the                            patient. Return to normal activities tomorrow.                            Written discharge instructions were provided to the                            patient.                           - Resume previous diet.                            - Continue present medications. Wilhemina Bonito. Marina Goodell, MD 06/24/2021 11:03:45 AM This report has been signed electronically.

## 2021-06-24 NOTE — Progress Notes (Signed)
PT taken to PACU. Monitors in place. VSS. Report given to RN. 

## 2021-06-24 NOTE — Progress Notes (Signed)
VS-CW  Pt's states no medical or surgical changes since previsit or office visit.  

## 2021-06-26 ENCOUNTER — Telehealth: Payer: Self-pay

## 2021-06-26 NOTE — Telephone Encounter (Signed)
  Follow up Call-  Call back number 06/24/2021  Post procedure Call Back phone  # [phone]  Permission to leave phone message Yes  Some recent data might be hidden     Patient questions:  Do you have a fever, pain , or abdominal swelling? No. Pain Score  0 *  Have you tolerated food without any problems? Yes.    Have you been able to return to your normal activities? Yes.    Do you have any questions about your discharge instructions: Diet   No. Medications  No. Follow up visit  No.  Do you have questions or concerns about your Care? No.  Actions: * If pain score is 4 or above: No action needed, pain <4.[facility] you developed a fever since your procedure? no  2.   Have you had an respiratory symptoms (SOB or cough) since your procedure? no  3.   Have you tested positive for COVID 19 since your procedure no  4.   Have you had any family members/close contacts diagnosed with the COVID 19 since your procedure?  no   If yes to any of these questions please route to Laverna Peace, RN and Karlton Lemon, RN

## 2021-08-13 ENCOUNTER — Other Ambulatory Visit: Payer: Self-pay

## 2021-08-13 ENCOUNTER — Encounter: Payer: Self-pay | Admitting: Family Medicine

## 2021-08-13 ENCOUNTER — Ambulatory Visit (INDEPENDENT_AMBULATORY_CARE_PROVIDER_SITE_OTHER): Payer: No Typology Code available for payment source | Admitting: Family Medicine

## 2021-08-13 VITALS — BP 119/70 | HR 86 | Temp 97.8°F | Ht 74.02 in | Wt 170.6 lb

## 2021-08-13 DIAGNOSIS — Z0000 Encounter for general adult medical examination without abnormal findings: Secondary | ICD-10-CM | POA: Diagnosis not present

## 2021-08-13 DIAGNOSIS — Z125 Encounter for screening for malignant neoplasm of prostate: Secondary | ICD-10-CM | POA: Diagnosis not present

## 2021-08-13 NOTE — Progress Notes (Signed)
Office Note 08/13/2021  CC:  Chief Complaint  Patient presents with   Annual Exam    HPI:  Darryl Stanton is a 60 y.o. White male who is here for annual health maintenance exam. Feeling well. Active, intermittent fasting diet still, doing well and maintaining wt around 165 lbs.   Past Medical History:  Diagnosis Date   Allergic rhinitis    Allergy    COVID-19 virus infection 05/21/2021   Duodenal ulcer    Elevated PSA    bx neg   Eosinophilic esophagitis 2019   EGD appearance did not match + bx results. Allergy testing neg with allergist.  Oral budesonide slurry + food vigilance for food trigger. of sx's.   GERD (gastroesophageal reflux disease)    past hx many years ago   Rosacea     Past Surgical History:  Procedure Laterality Date   COLONOSCOPY  approx 2012   2012 normal.  2022 normal. Recall 2032.   ESOPHAGOGASTRODUODENOSCOPY  02/2018   PECTUS EXCAVATUM REPAIR     Age 47   prsostate biopsy  02/2017   for elevated PSA; 1 core "suspicious" but urol felt it was NOT prostate ca (Dr. Retta Diones): most recent PSA 08/2019 stable.   UPPER GASTROINTESTINAL ENDOSCOPY      Family History  Problem Relation Age of Onset   Hyperlipidemia Mother    Hyperlipidemia Sister    Allergic rhinitis Neg Hx    Asthma Neg Hx    Eczema Neg Hx    Urticaria Neg Hx    Colon polyps Neg Hx    Crohn's disease Neg Hx    Esophageal cancer Neg Hx    Rectal cancer Neg Hx    Stomach cancer Neg Hx     Social History   Socioeconomic History   Marital status: Married    Spouse name: Not on file   Number of children: Not on file   Years of education: Not on file   Highest education level: Not on file  Occupational History   Not on file  Tobacco Use   Smoking status: Never   Smokeless tobacco: Former    Types: Chew    Quit date: 11/23/1984  Vaping Use   Vaping Use: Never used  Substance and Sexual Activity   Alcohol use: Yes    Alcohol/week: 2.0 standard drinks    Types: 2 Cans  of beer per week    Comment: a couple time a week   Drug use: Never   Sexual activity: Not on file  Other Topics Concern   Not on file  Social History Narrative   Married, 2 sons.   Orig from [place], [place].   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   No tob, occ alc.   Social Determinants of Health   Financial Resource Strain: Not on file  Food Insecurity: Not on file  Transportation Needs: Not on file  Physical Activity: Not on file  Stress: Not on file  Social Connections: Not on file  Intimate Partner Violence: Not on file    Outpatient Medications Prior to Visit  Medication Sig Dispense Refill   fluticasone (FLONASE) 50 MCG/ACT nasal spray Place 2 sprays into both nostrils daily. For treatment of nasal/sinus allergies. 16 g 6   No facility-administered medications prior to visit.    Allergies  Allergen Reactions   Ibuprofen Other (See Comments)    Other reaction(s): stomach upset    ROS Review  of Systems  Constitutional:  Negative for appetite change, chills, fatigue and fever.  HENT:  Negative for congestion, dental problem, ear pain and sore throat.   Eyes:  Negative for discharge, redness and visual disturbance.  Respiratory:  Negative for cough, chest tightness, shortness of breath and wheezing.   Cardiovascular:  Negative for chest pain, palpitations and leg swelling.  Gastrointestinal:  Negative for abdominal pain, blood in stool, diarrhea, nausea and vomiting.  Genitourinary:  Negative for difficulty urinating, dysuria, flank pain, frequency, hematuria and urgency.  Musculoskeletal:  Negative for arthralgias, back pain, joint swelling, myalgias and neck stiffness.  Skin:  Negative for pallor and rash.  Neurological:  Negative for dizziness, speech difficulty, weakness and headaches.  Hematological:  Negative for adenopathy. Does not bruise/bleed easily.  Psychiatric/Behavioral:  Negative for confusion and  sleep disturbance. The patient is not nervous/anxious.    PE; Vitals with BMI 08/13/2021 06/24/2021 06/24/2021  Height 6' 2.016" - -  Weight 170 lbs 10 oz - -  BMI 21.89 - -  Systolic 119 123 702  Diastolic 70 78 70  Pulse 86 62 65     Gen: Alert, well appearing.  Patient is oriented to person, place, time, and situation. AFFECT: pleasant, lucid thought and speech. ENT: Ears: EACs clear, normal epithelium.  TMs with good light reflex and landmarks bilaterally.  Eyes: no injection, icteris, swelling, or exudate.  EOMI, PERRLA. Nose: no drainage or turbinate edema/swelling.  No injection or focal lesion.  Mouth: lips without lesion/swelling.  Oral mucosa pink and moist.  Dentition intact and without obvious caries or gingival swelling.  Oropharynx without erythema, exudate, or swelling.  Neck: supple/nontender.  No LAD, mass, or TM.  Carotid pulses 2+ bilaterally, without bruits. CV: RRR, no m/r/g.   LUNGS: CTA bilat, nonlabored resps, good aeration in all lung fields. ABD: soft, NT, ND, BS normal.  No hepatospenomegaly or mass.  No bruits. EXT: no clubbing, cyanosis, or edema.  Musculoskeletal: no joint swelling, erythema, warmth, or tenderness.  ROM of all joints intact. Skin - no sores or suspicious lesions or rashes or color changes  Pertinent labs:  Lab Results  Component Value Date   TSH 2.21 08/13/2020   Lab Results  Component Value Date   WBC 3.9 (L) 08/13/2020   HGB 13.9 08/13/2020   HCT 41.7 08/13/2020   MCV 96.0 08/13/2020   PLT 213.0 08/13/2020   Lab Results  Component Value Date   CREATININE 1.13 08/13/2020   BUN 16 08/13/2020   NA 139 08/13/2020   K 4.3 08/13/2020   CL 103 08/13/2020   CO2 30 08/13/2020   Lab Results  Component Value Date   ALT 28 08/13/2020   AST 23 08/13/2020   ALKPHOS 72 08/13/2020   BILITOT 0.7 08/13/2020   Lab Results  Component Value Date   CHOL 198 08/13/2020   Lab Results  Component Value Date   HDL 74.50 08/13/2020   Lab  Results  Component Value Date   LDLCALC 112 (H) 08/13/2020   Lab Results  Component Value Date   TRIG 57.0 08/13/2020   Lab Results  Component Value Date   CHOLHDL 3 08/13/2020   Lab Results  Component Value Date   PSA1 2.7 10/22/2020    ASSESSMENT AND PLAN:   Health maintenance exam: Reviewed age and gender appropriate health maintenance issues (prudent diet, regular exercise, health risks of tobacco and excessive alcohol, use of seatbelts, fire alarms in home, use of sunscreen).  Also reviewed  age and gender appropriate health screening as well as vaccine recommendations. Vaccines: Flu->pt declined.  Otherwise UTD. Labs: fasting HP labs ordered. Prostate ca screening: hx elev PSA, most recent stable at 2.7->followed by urology, has appt soon. Colon ca screening: recall 2032.  An After Visit Summary was printed and given to the patient.  FOLLOW UP:  Return in about 1 year (around 08/13/2022) for annual CPE (fasting).  Signed:  Santiago Bumpers, MD           08/13/2021

## 2021-08-13 NOTE — Patient Instructions (Signed)
Health Maintenance, Male Adopting a healthy lifestyle and getting preventive care are important in promoting health and wellness. Ask your health care provider about: The right schedule for you to have regular tests and exams. Things you can do on your own to prevent diseases and keep yourself healthy. What should I know about diet, weight, and exercise? Eat a healthy diet  Eat a diet that includes plenty of vegetables, fruits, low-fat dairy products, and lean protein. Do not eat a lot of foods that are high in solid fats, added sugars, or sodium. Maintain a healthy weight Body mass index (BMI) is a measurement that can be used to identify possible weight problems. It estimates body fat based on height and weight. Your health care provider can help determine your BMI and help you achieve or maintain a healthy weight. Get regular exercise Get regular exercise. This is one of the most important things you can do for your health. Most adults should: Exercise for at least 150 minutes each week. The exercise should increase your heart rate and make you sweat (moderate-intensity exercise). Do strengthening exercises at least twice a week. This is in addition to the moderate-intensity exercise. Spend less time sitting. Even light physical activity can be beneficial. Watch cholesterol and blood lipids Have your blood tested for lipids and cholesterol at 60 years of age, then have this test every 5 years. You may need to have your cholesterol levels checked more often if: Your lipid or cholesterol levels are high. You are older than 60 years of age. You are at high risk for heart disease. What should I know about cancer screening? Many types of cancers can be detected early and may often be prevented. Depending on your health history and family history, you may need to have cancer screening at various ages. This may include screening for: Colorectal cancer. Prostate cancer. Skin cancer. Lung  cancer. What should I know about heart disease, diabetes, and high blood pressure? Blood pressure and heart disease High blood pressure causes heart disease and increases the risk of stroke. This is more likely to develop in people who have high blood pressure readings, are of African descent, or are overweight. Talk with your health care provider about your target blood pressure readings. Have your blood pressure checked: Every 3-5 years if you are 18-39 years of age. Every year if you are 40 years old or older. If you are between the ages of 65 and 75 and are a current or former smoker, ask your health care provider if you should have a one-time screening for abdominal aortic aneurysm (AAA). Diabetes Have regular diabetes screenings. This checks your fasting blood sugar level. Have the screening done: Once every three years after age 45 if you are at a normal weight and have a low risk for diabetes. More often and at a younger age if you are overweight or have a high risk for diabetes. What should I know about preventing infection? Hepatitis B If you have a higher risk for hepatitis B, you should be screened for this virus. Talk with your health care provider to find out if you are at risk for hepatitis B infection. Hepatitis C Blood testing is recommended for: Everyone born from 1945 through 1965. Anyone with known risk factors for hepatitis C. Sexually transmitted infections (STIs) You should be screened each year for STIs, including gonorrhea and chlamydia, if: You are sexually active and are younger than 60 years of age. You are older than 60 years   of age and your health care provider tells you that you are at risk for this type of infection. Your sexual activity has changed since you were last screened, and you are at increased risk for chlamydia or gonorrhea. Ask your health care provider if you are at risk. Ask your health care provider about whether you are at high risk for HIV.  Your health care provider may recommend a prescription medicine to help prevent HIV infection. If you choose to take medicine to prevent HIV, you should first get tested for HIV. You should then be tested every 3 months for as long as you are taking the medicine. Follow these instructions at home: Lifestyle Do not use any products that contain nicotine or tobacco, such as cigarettes, e-cigarettes, and chewing tobacco. If you need help quitting, ask your health care provider. Do not use street drugs. Do not share needles. Ask your health care provider for help if you need support or information about quitting drugs. Alcohol use Do not drink alcohol if your health care provider tells you not to drink. If you drink alcohol: Limit how much you have to 0-2 drinks a day. Be aware of how much alcohol is in your drink. In the [place] one drink equals one 12 oz bottle of beer (355 mL), one 5 oz glass of wine (148 mL), or one 1 oz glass of hard liquor (44 mL). General instructions Schedule regular health, dental, and eye exams. Stay current with your vaccines. Tell your health care provider if: You often feel depressed. You have ever been abused or do not feel safe at home. Summary Adopting a healthy lifestyle and getting preventive care are important in promoting health and wellness. Follow your health care provider's instructions about healthy diet, exercising, and getting tested or screened for diseases. Follow your health care provider's instructions on monitoring your cholesterol and blood pressure. This information is not intended to replace advice given to you by your health care provider. Make sure you discuss any questions you have with your health care provider. Document Revised: 01/17/2021 Document Reviewed: 11/02/2018 Elsevier Patient Education  2022 Elsevier Inc.  

## 2021-08-14 LAB — COMPREHENSIVE METABOLIC PANEL
ALT: 15 U/L (ref 0–53)
AST: 16 U/L (ref 0–37)
Albumin: 4.4 g/dL (ref 3.5–5.2)
Alkaline Phosphatase: 65 U/L (ref 39–117)
BUN: 19 mg/dL (ref 6–23)
CO2: 26 mEq/L (ref 19–32)
Calcium: 9.3 mg/dL (ref 8.4–10.5)
Chloride: 104 mEq/L (ref 96–112)
Creatinine, Ser: 1.19 mg/dL (ref 0.40–1.50)
GFR: 66.44 mL/min (ref >60.00)
Glucose, Bld: 86 mg/dL (ref 70–99)
Potassium: 4.3 mEq/L (ref 3.5–5.1)
Sodium: 139 mEq/L (ref 135–145)
Total Bilirubin: 0.5 mg/dL (ref 0.2–1.2)
Total Protein: 6.9 g/dL (ref 6.0–8.3)

## 2021-08-14 LAB — CBC WITH DIFFERENTIAL/PLATELET
Basophils Absolute: 0 10*3/uL (ref 0.0–0.1)
Basophils Relative: 0.8 % (ref 0.0–3.0)
Eosinophils Absolute: 0.1 10*3/uL (ref 0.0–0.7)
Eosinophils Relative: 3.1 % (ref 0.0–5.0)
HCT: 42.1 % (ref 39.0–52.0)
Hemoglobin: 13.8 g/dL (ref 13.0–17.0)
Lymphocytes Relative: 29.5 % (ref 12.0–46.0)
Lymphs Abs: 1.1 10*3/uL (ref 0.7–4.0)
MCHC: 32.9 g/dL (ref 30.0–36.0)
MCV: 93.4 fl (ref 78.0–100.0)
Monocytes Absolute: 0.5 10*3/uL (ref 0.1–1.0)
Monocytes Relative: 14.1 % — ABNORMAL HIGH (ref 3.0–12.0)
Neutro Abs: 2 10*3/uL (ref 1.4–7.7)
Neutrophils Relative %: 52.5 % (ref 43.0–77.0)
Platelets: 198 10*3/uL (ref 150.0–400.0)
RBC: 4.51 Mil/uL (ref 4.22–5.81)
RDW: 14.6 % (ref 11.5–15.5)
WBC: 3.7 10*3/uL — ABNORMAL LOW (ref 4.0–10.5)

## 2021-08-14 LAB — LIPID PANEL
Cholesterol: 207 mg/dL — ABNORMAL HIGH (ref 0–200)
HDL: 79.2 mg/dL (ref >39.00)
LDL Cholesterol: 116 mg/dL — ABNORMAL HIGH (ref 0–99)
NonHDL: 128.11
Total CHOL/HDL Ratio: 3
Triglycerides: 63 mg/dL (ref 0.0–149.0)
VLDL: 12.6 mg/dL (ref 0.0–40.0)

## 2021-08-14 LAB — TSH: TSH: 2.01 u[IU]/mL (ref 0.35–5.50)

## 2021-10-10 ENCOUNTER — Other Ambulatory Visit: Payer: Self-pay

## 2021-10-10 ENCOUNTER — Ambulatory Visit: Payer: No Typology Code available for payment source | Admitting: Family Medicine

## 2021-10-10 VITALS — BP 120/82 | HR 63 | Temp 97.6°F | Ht 74.02 in | Wt 177.2 lb

## 2021-10-10 DIAGNOSIS — J069 Acute upper respiratory infection, unspecified: Secondary | ICD-10-CM

## 2021-10-10 DIAGNOSIS — R0982 Postnasal drip: Secondary | ICD-10-CM

## 2021-10-10 MED ORDER — AMOXICILLIN 875 MG PO TABS: 875.0000 mg | ORAL_TABLET | Freq: Two times a day (BID) | ORAL | 0 refills | Status: AC

## 2021-10-10 NOTE — Progress Notes (Signed)
OFFICE VISIT  10/10/2021  CC:  Chief Complaint  Patient presents with   Nasal Congestion    Pt has been using flonase, oct sudafed and nightquil.    HPI:    Patient is a 60 y.o. male who presents for nasal congestion.  Darryl Stanton reports onset of significant postnasal drip approximately 10 to 12 days ago.  No fever, no face pain, no headache.  Says he feels a mild sense of malaise but says the medications he is taking to treat the symptoms make it feel worse.  Taking Sudafed alternating with some NyQuil.  Is on Flonase chronically.  Nothing really to blow out of his nose.  A little bit of cough in the morning. No chest tightness or wheezing. Says he gets this a few times a year and at about this point in the illness it usually starts to turn into more classic sinusitis symptoms.  Past Medical History:  Diagnosis Date   Allergic rhinitis    Allergy    COVID-19 virus infection 05/21/2021   Duodenal ulcer    Elevated PSA    bx neg   Eosinophilic esophagitis 2019   EGD appearance did not match + bx results. Allergy testing neg with allergist.  Oral budesonide slurry + food vigilance for food trigger. of sx's.   GERD (gastroesophageal reflux disease)    past hx many years ago   Rosacea     Past Surgical History:  Procedure Laterality Date   COLONOSCOPY  approx 2012   2012 normal.  2022 normal. Recall 2032.   ESOPHAGOGASTRODUODENOSCOPY  02/2018   PECTUS EXCAVATUM REPAIR     Age 77   prsostate biopsy  02/2017   for elevated PSA; 1 core "suspicious" but urol felt it was NOT prostate ca (Dr. Retta Diones): most recent PSA 08/2019 stable.   UPPER GASTROINTESTINAL ENDOSCOPY      Outpatient Medications Prior to Visit  Medication Sig Dispense Refill   fluticasone (FLONASE) 50 MCG/ACT nasal spray Place 2 sprays into both nostrils daily. For treatment of nasal/sinus allergies. 16 g 6   No facility-administered medications prior to visit.    Allergies  Allergen Reactions   Ibuprofen  Other (See Comments)    Other reaction(s): stomach upset    ROS As per HPI  PE: Vitals with BMI 10/10/2021 08/13/2021 06/24/2021  Height 6' 2.02" 6' 2.016" -  Weight 177 lbs 3 oz 170 lbs 10 oz -  BMI 22.74 21.89 -  Systolic 120 119 244  Diastolic 82 70 78  Pulse 63 86 62   VS: noted--normal. Gen: alert, NAD, NONTOXIC APPEARING. HEENT: eyes without injection, drainage, or swelling.  Ears: EACs clear, R TM with dullness and question of yellow fluid behind TM, question mild R TM bulge.  Left TM with normal light reflex and landmarks.  Nose: Minimal clear rhinorrhea, with some dried, crusty exudate adherent to mildly injected mucosa.  No purulent d/c.  No paranasal sinus TTP.  No facial swelling.  Throat and mouth without focal lesion.  No pharyngial swelling, erythema, or exudate.   Neck: supple, no LAD.   LUNGS: CTA bilat, nonlabored resps.   CV: RRR, no m/r/g. EXT: no c/c/e SKIN: no rash   LABS:    Chemistry      Component Value Date/Time   NA 139 08/13/2021 0832   NA 141 06/20/2019 0000   K 4.3 08/13/2021 0832   CL 104 08/13/2021 0832   CO2 26 08/13/2021 0832   BUN 19 08/13/2021 0102  BUN 14 06/20/2019 0000   CREATININE 1.19 08/13/2021 0832   GLU 91 06/20/2019 0000      Component Value Date/Time   CALCIUM 9.3 08/13/2021 0832   ALKPHOS 65 08/13/2021 0832   AST 16 08/13/2021 0832   ALT 15 08/13/2021 0832   BILITOT 0.5 08/13/2021 0832     Lab Results  Component Value Date   WBC 3.7 (L) 08/13/2021   HGB 13.8 08/13/2021   HCT 42.1 08/13/2021   MCV 93.4 08/13/2021   PLT 198.0 08/13/2021   IMPRESSION AND PLAN:  URI, prolonged. Will treat with amoxicillin 875 twice daily x 10 days. Stop Flonase and over-the-counter cold medicines for now.  Use saline nasal spray several times a day.    An After Visit Summary was printed and given to the patient.  FOLLOW UP: Return if symptoms worsen or fail to improve.  Signed:  Santiago Bumpers, MD           10/10/2021

## 2021-10-20 NOTE — Progress Notes (Signed)
History of Present Illness: Here for prostate follow up.  Underwent TRUS/Bx of prostate on 4.23.2018. Prostatic volume 19.5 ml. PSA 3.75. PSAD 0.19. 1/12 cores (left apex medial) returned atypia.   Followup PSA results:  11.30.2021--2.75  11.29.2022: No GU c/o's over the past year. IPSS 3  QoL score 1 His wife is having issues, as her mom died suddenly in 18-Jun-2023.  She has a significant amount of work to do with the estate.  Past Medical History:  Diagnosis Date   Allergic rhinitis    Allergy    COVID-19 virus infection 05/21/2021   Duodenal ulcer    Elevated PSA    bx neg   Eosinophilic esophagitis 2019   EGD appearance did not match + bx results. Allergy testing neg with allergist.  Oral budesonide slurry + food vigilance for food trigger. of sx's.   GERD (gastroesophageal reflux disease)    past hx many years ago   Rosacea     Past Surgical History:  Procedure Laterality Date   COLONOSCOPY  approx 2012   2012 normal.  2022 normal. Recall 2032.   ESOPHAGOGASTRODUODENOSCOPY  02/2018   PECTUS EXCAVATUM REPAIR     Age 59   prsostate biopsy  02/2017   for elevated PSA; 1 core "suspicious" but urol felt it was NOT prostate ca (Dr. Retta Diones): most recent PSA 08/2019 stable.   UPPER GASTROINTESTINAL ENDOSCOPY      Home Medications:  Allergies as of 10/21/2021       Reactions   Ibuprofen Other (See Comments)   Other reaction(s): stomach upset        Medication List        Accurate as of October 20, 2021 11:09 AM. If you have any questions, ask your nurse or doctor.          fluticasone 50 MCG/ACT nasal spray Commonly known as: FLONASE Place 2 sprays into both nostrils daily. For treatment of nasal/sinus allergies.        Allergies:  Allergies  Allergen Reactions   Ibuprofen Other (See Comments)    Other reaction(s): stomach upset    Family History  Problem Relation Age of Onset   Hyperlipidemia Mother    Hyperlipidemia Sister    Allergic  rhinitis Neg Hx    Asthma Neg Hx    Eczema Neg Hx    Urticaria Neg Hx    Colon polyps Neg Hx    Crohn's disease Neg Hx    Esophageal cancer Neg Hx    Rectal cancer Neg Hx    Stomach cancer Neg Hx     Social History:  reports that he has never smoked. He quit smokeless tobacco use about 36 years ago.  His smokeless tobacco use included chew. He reports current alcohol use of about 2.0 standard drinks per week. He reports that he does not use drugs.  ROS: A complete review of systems was performed.  All systems are negative except for pertinent findings as noted.  Physical Exam:  Vital signs in last 24 hours: There were no vitals taken for this visit. Constitutional:  Alert and oriented, No acute distress Cardiovascular: Regular rate  Respiratory: Normal respiratory effort GI: Abdomen is soft, nontender, nondistended, no abdominal masses. No CVAT.  No inguinal hernias Genitourinary: Normal male phallus, testes are descended bilaterally and non-tender and without masses, scrotum is normal in appearance without lesions or masses, perineum is normal on inspection.  Prostate size 30 mL, symmetric, nonnodular, nontender. Lymphatic: No lymphadenopathy Neurologic: Grossly  intact, no focal deficits Psychiatric: Normal mood and affect  I have reviewed prior pt notes  I have reviewed notes from referring/previous physicians  I have reviewed urinalysis results  I have reviewed prior PSA results   Impression/Assessment:  Elevated PSA with negative biopsy except for a small amount of atypia about 4-1/2 years ago.  PSA last year 2.7, not checked since then  Plan:  PSA is checked today  I will see him back in a year for recheck

## 2021-10-21 ENCOUNTER — Ambulatory Visit (INDEPENDENT_AMBULATORY_CARE_PROVIDER_SITE_OTHER): Payer: No Typology Code available for payment source | Admitting: Urology

## 2021-10-21 ENCOUNTER — Other Ambulatory Visit: Payer: Self-pay

## 2021-10-21 ENCOUNTER — Encounter: Payer: Self-pay | Admitting: Urology

## 2021-10-21 VITALS — BP 131/75 | HR 61

## 2021-10-21 DIAGNOSIS — R972 Elevated prostate specific antigen [PSA]: Secondary | ICD-10-CM

## 2021-10-21 DIAGNOSIS — N401 Enlarged prostate with lower urinary tract symptoms: Secondary | ICD-10-CM | POA: Diagnosis not present

## 2021-10-21 LAB — URINALYSIS, ROUTINE W REFLEX MICROSCOPIC
Bilirubin, UA: NEGATIVE
Glucose, UA: NEGATIVE
Ketones, UA: NEGATIVE
Leukocytes,UA: NEGATIVE
Nitrite, UA: NEGATIVE
Protein,UA: NEGATIVE
RBC, UA: NEGATIVE
Specific Gravity, UA: <1.005 — ABNORMAL LOW (ref 1.005–1.030)
Urobilinogen, Ur: 0.2 mg/dL (ref 0.2–1.0)
pH, UA: 5 (ref 5.0–7.5)

## 2021-10-21 NOTE — Progress Notes (Signed)

## 2021-10-22 LAB — PSA: Prostate Specific Ag, Serum: 2.2 ng/mL (ref 0.0–4.0)

## 2022-08-19 ENCOUNTER — Encounter: Payer: No Typology Code available for payment source | Admitting: Family Medicine

## 2022-08-24 DIAGNOSIS — Z0000 Encounter for general adult medical examination without abnormal findings: Secondary | ICD-10-CM | POA: Diagnosis not present

## 2022-08-24 DIAGNOSIS — Z13220 Encounter for screening for lipoid disorders: Secondary | ICD-10-CM | POA: Diagnosis not present

## 2022-09-28 DIAGNOSIS — R972 Elevated prostate specific antigen [PSA]: Secondary | ICD-10-CM | POA: Diagnosis not present

## 2022-10-20 ENCOUNTER — Ambulatory Visit: Payer: No Typology Code available for payment source | Admitting: Urology

## 2023-03-28 DIAGNOSIS — Z681 Body mass index (BMI) 19 or less, adult: Secondary | ICD-10-CM | POA: Diagnosis not present

## 2023-03-28 DIAGNOSIS — S0191XA Laceration without foreign body of unspecified part of head, initial encounter: Secondary | ICD-10-CM | POA: Diagnosis not present

## 2023-04-01 DIAGNOSIS — R21 Rash and other nonspecific skin eruption: Secondary | ICD-10-CM | POA: Diagnosis not present

## 2023-04-01 DIAGNOSIS — T7840XA Allergy, unspecified, initial encounter: Secondary | ICD-10-CM | POA: Diagnosis not present

## 2023-06-22 DIAGNOSIS — B354 Tinea corporis: Secondary | ICD-10-CM | POA: Diagnosis not present

## 2023-08-27 DIAGNOSIS — Z13220 Encounter for screening for lipoid disorders: Secondary | ICD-10-CM | POA: Diagnosis not present

## 2023-08-27 DIAGNOSIS — Z0000 Encounter for general adult medical examination without abnormal findings: Secondary | ICD-10-CM | POA: Diagnosis not present

## 2023-09-24 DIAGNOSIS — R972 Elevated prostate specific antigen [PSA]: Secondary | ICD-10-CM | POA: Diagnosis not present

## 2023-09-30 DIAGNOSIS — N400 Enlarged prostate without lower urinary tract symptoms: Secondary | ICD-10-CM | POA: Diagnosis not present

## 2024-09-01 DIAGNOSIS — Z0000 Encounter for general adult medical examination without abnormal findings: Secondary | ICD-10-CM | POA: Diagnosis not present

## 2024-09-01 DIAGNOSIS — Z13220 Encounter for screening for lipoid disorders: Secondary | ICD-10-CM | POA: Diagnosis not present

## 2024-09-01 DIAGNOSIS — E782 Mixed hyperlipidemia: Secondary | ICD-10-CM | POA: Diagnosis not present

## 2024-09-04 ENCOUNTER — Other Ambulatory Visit: Payer: Self-pay

## 2024-09-04 DIAGNOSIS — E782 Mixed hyperlipidemia: Secondary | ICD-10-CM

## 2024-09-05 ENCOUNTER — Other Ambulatory Visit: Payer: Self-pay

## 2024-09-05 DIAGNOSIS — E782 Mixed hyperlipidemia: Secondary | ICD-10-CM

## 2024-10-02 DIAGNOSIS — R972 Elevated prostate specific antigen [PSA]: Secondary | ICD-10-CM | POA: Diagnosis not present

## 2024-10-02 DIAGNOSIS — N400 Enlarged prostate without lower urinary tract symptoms: Secondary | ICD-10-CM | POA: Diagnosis not present
